# Patient Record
Sex: Female | Born: 2000
Health system: Southern US, Community
[De-identification: ages and names within clinical notes are randomized; demographics above are authoritative.]

## PROBLEM LIST (undated history)

## (undated) ENCOUNTER — Ambulatory Visit

## (undated) DIAGNOSIS — J45901 Unspecified asthma with (acute) exacerbation: Secondary | ICD-10-CM

## (undated) DIAGNOSIS — F329 Major depressive disorder, single episode, unspecified: Secondary | ICD-10-CM

## (undated) DIAGNOSIS — J45909 Unspecified asthma, uncomplicated: Secondary | ICD-10-CM

## (undated) DIAGNOSIS — T7840XA Allergy, unspecified, initial encounter: Secondary | ICD-10-CM

## (undated) DIAGNOSIS — R51 Headache: Secondary | ICD-10-CM

## (undated) DIAGNOSIS — E669 Obesity, unspecified: Secondary | ICD-10-CM

## (undated) DIAGNOSIS — Z889 Allergy status to unspecified drugs, medicaments and biological substances status: Secondary | ICD-10-CM

## (undated) DIAGNOSIS — R4589 Other symptoms and signs involving emotional state: Secondary | ICD-10-CM

## (undated) DIAGNOSIS — K219 Gastro-esophageal reflux disease without esophagitis: Secondary | ICD-10-CM

## (undated) DIAGNOSIS — R519 Headache, unspecified: Secondary | ICD-10-CM

## (undated) DIAGNOSIS — L709 Acne, unspecified: Secondary | ICD-10-CM

## (undated) HISTORY — DX: Other symptoms and signs involving emotional state: R45.89

## (undated) HISTORY — DX: Unspecified asthma with (acute) exacerbation: J45.901

## (undated) HISTORY — DX: Allergy, unspecified, initial encounter: T78.40XA

## (undated) HISTORY — DX: Gastro-esophageal reflux disease without esophagitis: K21.9

## (undated) HISTORY — PX: WISDOM TOOTH EXTRACTION: SHX21

## (undated) HISTORY — DX: Headache, unspecified: R51.9

## (undated) HISTORY — DX: Obesity, unspecified: E66.9

## (undated) HISTORY — DX: Headache: R51

## (undated) HISTORY — DX: Major depressive disorder, single episode, unspecified: F32.9

## (undated) HISTORY — DX: Unspecified asthma, uncomplicated: J45.909

## (undated) HISTORY — DX: Acne, unspecified: L70.9

---
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
## 2018-06-27 MED FILL — ALBUTEROL SULFATE HFA 108 (: 108 (90 BAS | 25 days supply | Qty: 9 | Fill #0

## 2018-07-16 MED FILL — ALBUTEROL SULFATE HFA 108 (: 108 (90 BAS | 25 days supply | Qty: 9 | Fill #1

## 2019-01-09 ENCOUNTER — Ambulatory Visit: Payer: Medicaid Other | Admitting: Advanced Practice Midwife

## 2019-05-24 ENCOUNTER — Emergency Department (HOSPITAL_COMMUNITY)
Admission: EM | Admit: 2019-05-24 | Discharge: 2019-05-24 | Disposition: A | Discharge status: Home / Self Care | Payer: Medicaid Other | Attending: Emergency Medicine | Admitting: Emergency Medicine

## 2019-05-24 ENCOUNTER — Other Ambulatory Visit: Payer: Self-pay

## 2019-05-24 ENCOUNTER — Emergency Department (HOSPITAL_COMMUNITY): Payer: Medicaid Other

## 2019-05-24 DIAGNOSIS — R0789 Other chest pain: Secondary | ICD-10-CM | POA: Diagnosis not present

## 2019-05-24 DIAGNOSIS — R110 Nausea: Secondary | ICD-10-CM | POA: Diagnosis not present

## 2019-05-24 DIAGNOSIS — R0602 Shortness of breath: Secondary | ICD-10-CM | POA: Insufficient documentation

## 2019-05-24 DIAGNOSIS — Z7722 Contact with and (suspected) exposure to environmental tobacco smoke (acute) (chronic): Secondary | ICD-10-CM | POA: Insufficient documentation

## 2019-05-24 DIAGNOSIS — N938 Other specified abnormal uterine and vaginal bleeding: Secondary | ICD-10-CM | POA: Insufficient documentation

## 2019-05-24 DIAGNOSIS — R42 Dizziness and giddiness: Secondary | ICD-10-CM | POA: Insufficient documentation

## 2019-05-24 DIAGNOSIS — F410 Panic disorder [episodic paroxysmal anxiety] without agoraphobia: Secondary | ICD-10-CM | POA: Insufficient documentation

## 2019-05-24 DIAGNOSIS — D649 Anemia, unspecified: Secondary | ICD-10-CM | POA: Insufficient documentation

## 2019-05-24 DIAGNOSIS — J45909 Unspecified asthma, uncomplicated: Secondary | ICD-10-CM | POA: Diagnosis not present

## 2019-05-24 DIAGNOSIS — R519 Headache, unspecified: Secondary | ICD-10-CM | POA: Insufficient documentation

## 2019-05-24 LAB — CBC
HCT: 28.9 % — ABNORMAL LOW (ref 36.0–46.0)
Hemoglobin: 7.1 g/dL — ABNORMAL LOW (ref 12.0–15.0)
MCH: 16.8 pg — ABNORMAL LOW (ref 26.0–34.0)
MCHC: 24.6 g/dL — ABNORMAL LOW (ref 30.0–36.0)
MCV: 68.5 fL — ABNORMAL LOW (ref 80.0–100.0)
Platelets: 492 10*3/uL — ABNORMAL HIGH (ref 150–400)
RBC: 4.22 MIL/uL (ref 3.87–5.11)
RDW: 20.9 % — ABNORMAL HIGH (ref 11.5–15.5)
WBC: 10.7 10*3/uL — ABNORMAL HIGH (ref 4.0–10.5)
nRBC: 0 % (ref 0.0–0.2)

## 2019-05-24 LAB — BASIC METABOLIC PANEL
Anion gap: 9 (ref 5–15)
BUN: 8 mg/dL (ref 6–20)
CO2: 24 mmol/L (ref 22–32)
Calcium: 9 mg/dL (ref 8.9–10.3)
Chloride: 106 mmol/L (ref 98–111)
Creatinine, Ser: 0.67 mg/dL (ref 0.44–1.00)
GFR calc Af Amer: >60 mL/min (ref >60)
GFR calc non Af Amer: >60 mL/min (ref >60)
Glucose, Bld: 92 mg/dL (ref 70–99)
Potassium: 4.3 mmol/L (ref 3.5–5.1)
Sodium: 139 mmol/L (ref 135–145)

## 2019-05-24 LAB — I-STAT BETA HCG BLOOD, ED (MC, WL, AP ONLY): I-stat hCG, quantitative: <5 m[IU]/mL (ref <5)

## 2019-05-24 LAB — TROPONIN I (HIGH SENSITIVITY): Troponin I (High Sensitivity): <2 ng/L (ref <18)

## 2019-05-24 MED ORDER — SODIUM CHLORIDE 0.9% FLUSH: 3.0000 mL | Freq: Once | INTRAVENOUS | Status: DC

## 2019-05-24 MED ORDER — FERROUS SULFATE 325 (65 FE) MG PO TABS: 325.0000 mg | ORAL_TABLET | Freq: Every day | ORAL | 0 refills | Status: DC

## 2019-05-24 NOTE — ED Notes (Signed)
Patient verbalizes understanding of discharge instructions. Opportunity for questioning and answers were provided. Armband removed by staff, pt discharged from ED.  

## 2019-05-24 NOTE — Discharge Instructions (Signed)
Start Iron pill once a day Please make an appointment with OBGYN for follow up Return to the ER if worsening

## 2019-05-24 NOTE — ED Provider Notes (Signed)
[facility] EMERGENCY DEPARTMENT Provider Note   CSN: [id number] Arrival date & time: 05/24/19  1346     History Chief Complaint  Patient presents with  . Chest Pain    Debbie Smith is a 19 y.o. female with history of migraines, asthma presents with a panic attack.  She states that she woke up this morning and felt like it was going to be a bad day but cannot really explain why.  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  She is also having sharp left-sided chest pain.  Symptoms lasted approximately 1 hour.  She came to the ED and since she has been here her symptoms have resolved.  She states that now she is having a mild frontal headache but otherwise does not have any of the symptoms she had this morning.  She reports irregular periods and sometimes will go a long time without a period and when she does have a period it will last 6 months.  She has been having light vaginal bleeding for approximately 3 weeks.  She does not take any iron supplementation.  She has been told she is anemic in the past.  She denies lightheadedness, syncope, shortness of breath, or chest pain that is been intermittent or ongoing this week. She does not take blood thinners. She denies blood in the stool.  HPI     Past Medical History:  Diagnosis Date  . Acne   . Allergy   . Asthma   . Depressed mood   . GERD (gastroesophageal reflux disease)   . Headache   . Moderate Persistent Asthma, poor control 10/07/2012  . Obesity     Patient Active Problem List   Diagnosis Date Noted  . Mild intermittent asthma, uncomplicated 06/10/2016  . Anemia 03/06/2016  . Migraine without aura and without status migrainosus, not intractable 09/24/2015  . Tension headache 09/24/2015  . Acanthosis nigricans 02/22/2015  . Vitamin D deficiency 10/15/2014  . Gastroesophageal reflux disease without  esophagitis 09/13/2014  . Depressed mood 09/13/2014  . Elevated BP 09/13/2014  . Acne vulgaris 09/13/2014  . Obesity, unspecified 10/07/2012  . Seasonal allergies 10/07/2012    Past Surgical History:  Procedure Laterality Date  . WISDOM TOOTH EXTRACTION       OB History   No obstetric history on file.     Family History  Problem Relation Age of Onset  . Asthma Mother   . Diabetes Mother   . Hypertension Mother   . Anemia Sister   . Asthma Sister   . Stroke Sister   . Mental illness Sister   . Learning disabilities Sister   . Diabetes Maternal Aunt   . Asthma Brother   . Hypertension Brother   . Mental illness Brother   . Learning disabilities Brother   . Diabetes Maternal Uncle   . Cardiomyopathy Maternal Uncle   . Sudden death Maternal Uncle   . Kidney disease Maternal Uncle   . Asthma Maternal Grandmother   . Diabetes Maternal Grandmother   . Cardiomyopathy Maternal Grandmother   . Hypertension Maternal Grandmother   . Kidney disease Maternal Grandmother   . Heart attack Maternal Grandmother     Social History   Tobacco Use  . Smoking status: Passive Smoke Exposure - Never Smoker  . Smokeless tobacco: Never Used  Substance Use Topics  . Alcohol use: No  .  Drug use: No    Home Medications Prior to Admission medications   Medication Sig Start Date End Date Taking? Authorizing Provider  albuterol (PROAIR HFA) 108 (90 Base) MCG/ACT inhaler Inhale 2 puffs into the lungs every 4 (four) hours as needed for wheezing or shortness of breath. 06/10/16   Gwenith Daily, MD  cetirizine (ZYRTEC) 10 MG tablet Take one tablet once daily as needed for allergies 06/10/16   Gwenith Daily, MD  Cholecalciferol (VITAMIN D-3) 5000 units TABS Take 5,000 Units by mouth once a week. Patient not taking: Reported on 09/22/2017 09/17/16   Keturah Shavers, MD  Cholecalciferol (VITAMIN D3) 5000 units TABS Take 1 tablet (5,000 Units total) by mouth daily. 09/22/17   Hilts,  Michael, MD  Magnesium Oxide 500 MG TABS Take by mouth.    [provider]  omeprazole (PRILOSEC) 20 MG capsule TAKE 1 CAPSULE BY MOUTH DAILY BEFORE A MEAL 03/06/16   Warnell Forester, MD  topiramate (TOPAMAX) 50 MG tablet Take 3 tablets (150 mg total) by mouth 2 (two) times daily. Patient taking differently: Take 150 mg by mouth 2 (two) times daily as needed (Headaches).  10/14/16   Keturah Shavers, MD  verapamil (CALAN) 80 MG tablet Take 1 tablet (80 mg total) by mouth 2 (two) times daily. 09/17/16   Keturah Shavers, MD    Allergies    Other  Review of Systems   Review of Systems  Respiratory: Positive for cough (resolved) and shortness of breath (resolved).   Cardiovascular: Positive for chest pain (resolved). Negative for palpitations.  Gastrointestinal: Positive for nausea (resolved). Negative for abdominal pain and vomiting.  Neurological: Positive for dizziness and headaches. Negative for syncope and light-headedness.  Psychiatric/Behavioral: The patient is nervous/anxious (resolved).   All other systems reviewed and are negative.   Physical Exam Updated Vital Signs BP (!) 157/98 (BP Location: Left Wrist)   Pulse (!) 112   Temp 98.7 F (37.1 C) (Oral)   Resp 18   Ht 5\' 9"  (1.753 m)   SpO2 100%   BMI 47.26 kg/m   Physical Exam Vitals and nursing note reviewed.  Constitutional:      General: She is not in acute distress.    Appearance: She is well-developed. She is obese. She is not ill-appearing.     Comments: Calm and cooperative. NAD  HENT:     Head: Normocephalic and atraumatic.  Eyes:     General: No scleral icterus.       Right eye: No discharge.        Left eye: No discharge.     Conjunctiva/sclera: Conjunctivae normal.     Pupils: Pupils are equal, round, and reactive to light.  Cardiovascular:     Rate and Rhythm: Normal rate and regular rhythm.  Pulmonary:     Effort: Pulmonary effort is normal. No respiratory distress.     Breath sounds: Normal  breath sounds.  Abdominal:     General: There is no distension.     Palpations: Abdomen is soft.     Tenderness: There is no abdominal tenderness.  Musculoskeletal:     Cervical back: Normal range of motion.  Skin:    General: Skin is warm and dry.  Neurological:     Mental Status: She is alert and oriented to person, place, and time.  Psychiatric:        Behavior: Behavior normal.     ED Results / Procedures / Treatments   Labs (all labs ordered are listed,  but only abnormal results are displayed) Labs Reviewed  CBC - Abnormal; Notable for the following components:      Result Value   WBC 10.7 (*)    Hemoglobin 7.1 (*)    HCT 28.9 (*)    MCV 68.5 (*)    MCH 16.8 (*)    MCHC 24.6 (*)    RDW 20.9 (*)    Platelets 492 (*)    All other components within normal limits  BASIC METABOLIC PANEL  I-STAT BETA HCG BLOOD, ED (MC, WL, AP ONLY)  TROPONIN I (HIGH SENSITIVITY)  TROPONIN I (HIGH SENSITIVITY)    EKG None  Radiology DG Chest 2 View  Result Date: 05/24/2019 CLINICAL DATA:  Chest pain. Additional provided: Patient reports left-sided chest pain onset today with shortness of breath, nausea and diaphoresis. EXAM: CHEST - 2 VIEW COMPARISON:  No pertinent prior studies available for comparison. FINDINGS: The cardiac silhouette is prominent in size, but is extension on this shallow inspiration radiograph. No appreciable airspace consolidation within the lungs. No evidence of pleural effusion or pneumothorax. No acute bony abnormality identified. IMPRESSION: The cardiac silhouette is prominent in size, although accentuated on this shallow inspiration radiograph. No evidence of airspace consolidation or pulmonary edema. Electronically Signed   By: Kellie Simmering DO   On: 05/24/2019 15:34    Procedures Procedures (including critical care time)  Medications Ordered in ED Medications  sodium chloride flush (NS) 0.9 % injection 3 mL (has no administration in time range)    ED  Course  I have reviewed the triage vital signs and the nursing notes.  Pertinent labs & imaging results that were available during my care of the patient were reviewed by me and considered in my medical decision making (see chart for details).  18 year old female presents with a reported panic attack with multiple symptoms including dizziness, shortness of breath, chest pain, nausea and diaphoresis.  She is mildly tachycardic on triage.  This is resolved on my exam.  She is essentially asymptomatic at this time and feels better other than a mild frontal headache.  EKG was sinus tachycardia.  Chest x-ray is negative.  Initial troponin is less than 2.  Chest pain is very atypical and patient states that it was from a panic attack therefore will not repeat this.  She endorses light vaginal bleeding for the past 3 weeks a well.  Hemoglobin is 7.1 down from 10 several years ago.  Her mother is at bedside and states that the patient was supposed to be taking iron but has not been.  She does not have an OB/GYN currently.  Since she is asymptomatic and bleeding is minimal we will start her on iron supplementation and have her follow-up with OB/GYN as an outpatient.  MDM Rules/Calculators/A&P                       Final Clinical Impression(s) / ED Diagnoses Final diagnoses:  Panic attack  Anemia, unspecified type  DUB (dysfunctional uterine bleeding)    Rx / DC Orders ED Discharge Orders    None       Recardo Evangelist, PA-C 05/24/19 1801    Isla Pence, MD 05/24/19 [phone]

## 2019-05-24 NOTE — ED Triage Notes (Signed)
Pt reports L sided chest pain onset today with associated shob, nausea and diaphoresis.

## 2019-07-11 ENCOUNTER — Encounter: Payer: Medicaid Other | Admitting: Women's Health

## 2019-07-25 ENCOUNTER — Other Ambulatory Visit (HOSPITAL_COMMUNITY): Payer: Self-pay | Admitting: Internal Medicine

## 2020-03-29 ENCOUNTER — Ambulatory Visit (INDEPENDENT_AMBULATORY_CARE_PROVIDER_SITE_OTHER): Payer: Medicaid Other | Admitting: Family Medicine

## 2020-03-29 ENCOUNTER — Encounter: Payer: Self-pay | Admitting: Family Medicine

## 2020-03-29 ENCOUNTER — Other Ambulatory Visit: Payer: Self-pay

## 2020-03-29 VITALS — BP 133/53 | HR 99 | Ht 69.0 in | Wt 381.2 lb

## 2020-03-29 DIAGNOSIS — Z23 Encounter for immunization: Secondary | ICD-10-CM

## 2020-03-29 DIAGNOSIS — F339 Major depressive disorder, recurrent, unspecified: Secondary | ICD-10-CM

## 2020-03-29 NOTE — Assessment & Plan Note (Addendum)
-  PHQ-9 score of 25, with score of 3 for question 9. When asked, patient states that she is currently not experiencing suicidal ideation -Reassurance provided. Encouraged participating in hobbies, engaging in meditation and utilizing support from family -list of therapists provided -suicide hotline provided -given clear instructions to call suicide hotline and/or go to ED if experiencing SI -f/u in 2 weeks for mood check and health maintenance, consider starting pharmacotherapy at this time

## 2020-03-29 NOTE — Progress Notes (Signed)
    SUBJECTIVE:   CHIEF COMPLAINT / HPI:   Depression Patient reports long standing history of depression. Initially came into the office in order to establish care as she has not been seen by a primary provider in 2-3 years. Reports that she has remembered feeling depressed periodically since the age of 35 but was not formally diagnosed until 20 years old. Lives with her sister but describes her mother as a significant role in her support system. Was on medication before but does not remember what medication, currently not on any medication as she previously stopped because "they didn't work." She has never participating in therapy sessions. Denies any prior psychiatric-related hospitalizations including the multiple times she attempted. First attempt was at 20 years old when she overdosed on a bottle of sleeping pills, she ended up sleeping for 15 hours and then woke up. Her most recent attempt was last year (2021) when she tried to cut herself with a hunter's knife. She ended up cutting herself superficially. Describes that what eventually stops her during these attempts is that she eventually calms down but really depends on the trigger, she also thinks of her mom which she says helps. Her trigger usually includes feeling like she has no control over her life. She tends to feel guilty whenever she takes time to care for herself. She sometimes feels useless and lies to sleep a lot, denies any trouble falling or staying asleep. Appetite is typically her wanting to eat only one meal daily. Shares that she feels she has a lot of responsibilities at home. Hobbies include reading and playing video games. Denies thoughts of harming herself but states that she does experience suicidal ideations occasionally with the last time being 2 weeks ago which soon resolved when she went to sleep.    OBJECTIVE:   BP (!) 133/53   Pulse 99   Ht 5\' 9"  (1.753 m)   Wt (!) 381 lb 3.2 oz (172.9 kg)   LMP 01/27/2020   SpO2  100%   BMI 56.29 kg/m   General: Patient well-appearing, in no acute distress. Neck: supple neck, non-tender thyroid, no evidence of lymphadenopathy CV: RRR, no murmurs or gallops auscultated Resp: CTAB, no rales or rhonchi  Abdomen: soft, nontender, BS+ Derm: skin warm and dry to touch, xerosis noted throughout both UE and LE bilaterally Ext: no LE edema noted bilaterally, no cuts fresh or healed noted along UE bilaterally  Neuro: alert, appropriately conversational, demonstrates logical and tangential thinking process Psych: mood appropriate, denies SI without plan or intent  ASSESSMENT/PLAN:   Depression, recurrent (HCC) -PHQ-9 score of 25, with score of 3 for question 9. When asked, patient states that she is currently not experiencing suicidal ideation -Reassurance provided. Encouraged participating in hobbies, engaging in meditation and utilizing support from family -list of therapists provided -suicide hotline provided -given clear instructions to call suicide hotline and/or go to ED if experiencing SI -f/u in 2 weeks for mood check and health maintenance, consider starting pharmacotherapy at this time    03/26/2020, DO [facility]

## 2020-03-29 NOTE — Patient Instructions (Addendum)
It was great seeing you today!  Today we discussed your mood. I want you to try the things that we talked about daily. Also try to participate in your hobbies and surrounding yourself by family and friends who are a significant part of your support system.   I am sorry that we did not get to address all your concerns at this visit. At your next appointment, we will try to address more of other concerns as well.   Please follow up at your next scheduled appointment in 2 weeks, if anything arises between now and then, please don't hesitate to contact our office.   Thank you for allowing [place] to be a part of your medical care!  Thank you, Dr. Robyne Peers    Therapy and Counseling Resources Most providers on this list will take Medicaid. Patients with commercial insurance or Medicare should contact their insurance company to get a list of in network providers.  [facility]ing [address] [place], [place] [address] [phone]  [facility]  [address] [place], [place] [address]      [phone]  [facility] [address]  [place], [place] [address] [phone]  [facility] [address], [address]  [place], [place] [address]       [phone]      Jovita Kussmaul [facility] [address], [place], [place] [phone]  [facility]:  [address] [place] [phone]  [facility]:  [address] [place] [place]ien[place] [phone]  [facility] (under & uninsured) [address]   [place] [phone]    [email or web address]    [facility] [address] . [place]    [phone]  [facility] -[address]     Phone:  [phone]     [facility]  [address]  [phone]   [facility] [address]       [place], [place] [phone]  [facility]: [address] [place], [place], [place]  [phone]   [facility] (Spanish therapist) [email or web address]  [address]   [place] [address]    [phone]    [facility]   [address],  [place], [place]  [phone]   [facility]  [address] [place]  [phone]  [facility]  [address] [place], [place]        [phone]  Open Access/Walk In Clinic under & uninsured  [facility]  [address] [place], [place] [phone] Crisis [phone]  [facility] [place],  (Spanish)   [address], [place]: [phone]) 8:30 - 12; 1 - 2:30  [facility] [employer],  [address] [place], [place]    [phone]):8:30 - 12; 2 - 3PM  [facility] [employer],  [address]  [place]; [phone]):   Mon - Fri 8 AM - 5 PM  [facility] [address] [place]  MWF 12:30 to 3:00 or call to schedule an appointment  [phone]  Specific Provider options Psychology Today  [email or web address] 1. click on find a therapist  2. enter your zip code 3. left side and select or tailor a therapist for your specific need.  [facility] Provider Directory [email or web address]  (Medicaid)   Follow all drop down to find a provider  Social Support program [facility] [place] [phone] or [employer] [address], [place], [place] Recovery support and educational   24- Hour Availability:  .  Marland Kitchen [facility]  . [address] [place], [place] [employer] [phone] Crisis [phone]  . [facility] [phone]  [facility]  [phone]   . [facility] [employer]  [phone] (after  hours)  . [facility]/Mobile Crisis   [phone]  . [place] National Suicide Hotline  641-671-2147 (TALK)  . Call 911 or go to emergency room  . [employer]  [phone]);  [place] and [employer]   . [facility] ACCESS  [phone]); [place], [place], [place], [place], [place], [place], [place]    If you are feeling suicidal or depression symptoms worsen please immediately go to:   24 Hour Availability [facility]  [address] [place], [place] [phone] Crisis [phone]    . If you are thinking about harming yourself or having thoughts of suicide, or if you know someone who is, seek help right away. . Call your doctor or mental health care provider. . Call 911 or go to a hospital emergency room to get immediate help, or ask a friend or family member to help you do these things. . Call the [place] National Suicide Prevention Lifeline's toll-free, 24-hour hotline at 1-800-273-TALK 5088877111) or TTY: 1-800-799-4 TTY 603-237-8659) to talk to a trained counselor. . If you are in crisis, make sure you are not left alone.  . If someone else is in crisis, make sure he or she is not left alone   [facility] of the [employer] (Domestic Violence, Rape & Victim Assistance [phone]  [facility] [employer] Crisis Services    (ONLY from 8am-4pm)    [phone]  [facility] Mobile Crisis Unit (24/7)   [phone]  [place] National Suicide Hotline   6163979904 Len Childs)

## 2020-04-10 ENCOUNTER — Ambulatory Visit: Payer: Medicaid Other | Admitting: Family Medicine

## 2020-04-26 ENCOUNTER — Other Ambulatory Visit (HOSPITAL_COMMUNITY): Payer: Self-pay

## 2020-04-26 ENCOUNTER — Other Ambulatory Visit (HOSPITAL_COMMUNITY): Payer: Self-pay | Admitting: Internal Medicine

## 2020-05-07 ENCOUNTER — Other Ambulatory Visit (HOSPITAL_COMMUNITY): Payer: Self-pay

## 2020-05-07 ENCOUNTER — Other Ambulatory Visit: Payer: Self-pay | Admitting: Internal Medicine

## 2020-05-14 ENCOUNTER — Other Ambulatory Visit: Payer: Self-pay | Admitting: Internal Medicine

## 2020-05-14 ENCOUNTER — Other Ambulatory Visit (HOSPITAL_COMMUNITY): Payer: Self-pay

## 2020-05-23 ENCOUNTER — Other Ambulatory Visit (HOSPITAL_COMMUNITY): Payer: Self-pay

## 2020-05-23 NOTE — Telephone Encounter (Signed)
Okay, thank you. I called and left a message with the pt.

## 2020-05-30 ENCOUNTER — Other Ambulatory Visit (HOSPITAL_COMMUNITY): Payer: Self-pay

## 2020-07-01 ENCOUNTER — Other Ambulatory Visit: Payer: Self-pay

## 2020-07-01 ENCOUNTER — Ambulatory Visit (INDEPENDENT_AMBULATORY_CARE_PROVIDER_SITE_OTHER): Payer: Medicaid Other

## 2020-07-01 ENCOUNTER — Ambulatory Visit (INDEPENDENT_AMBULATORY_CARE_PROVIDER_SITE_OTHER): Payer: Medicaid Other | Admitting: Family Medicine

## 2020-07-01 ENCOUNTER — Other Ambulatory Visit (HOSPITAL_COMMUNITY): Payer: Self-pay

## 2020-07-01 VITALS — BP 120/85 | HR 91 | Ht 69.0 in | Wt 380.2 lb

## 2020-07-01 DIAGNOSIS — F32A Depression, unspecified: Secondary | ICD-10-CM

## 2020-07-01 DIAGNOSIS — J302 Other seasonal allergic rhinitis: Secondary | ICD-10-CM

## 2020-07-01 DIAGNOSIS — J4520 Mild intermittent asthma, uncomplicated: Secondary | ICD-10-CM | POA: Diagnosis present

## 2020-07-01 DIAGNOSIS — Z23 Encounter for immunization: Secondary | ICD-10-CM

## 2020-07-01 DIAGNOSIS — F339 Major depressive disorder, recurrent, unspecified: Secondary | ICD-10-CM

## 2020-07-01 MED ORDER — ESCITALOPRAM OXALATE 10 MG PO TABS
10.0000 mg | ORAL_TABLET | Freq: Every day | ORAL | 0 refills | Status: DC
Start: 2020-07-01 — End: 2020-09-24
  Filled 2020-07-01 (×2): qty 14, 14d supply, fill #0

## 2020-07-01 MED ORDER — BUDESONIDE-FORMOTEROL FUMARATE 80-4.5 MCG/ACT IN AERO
2.0000 | INHALATION_SPRAY | RESPIRATORY_TRACT | 3 refills | Status: DC | PRN
Start: 2020-07-01 — End: 2022-03-26
  Filled 2020-07-01: qty 1, fill #0
  Filled 2020-07-01: qty 10.2, 30d supply, fill #0
  Filled 2020-11-11: qty 10.2, 30d supply, fill #1
  Filled 2021-03-11: qty 10.2, 30d supply, fill #0

## 2020-07-01 MED ORDER — AEROCHAMBER MINI CHAMBER DEVI
1.0000 [IU] | 0 refills | Status: AC | PRN
  Filled 2020-07-01: qty 1, fill #0

## 2020-07-01 MED ORDER — CETIRIZINE HCL 10 MG PO TABS: ORAL_TABLET | ORAL | 11 refills | Status: DC

## 2020-07-01 NOTE — Patient Instructions (Signed)
Dear Evert Kohl,   Today we discussed the following:   Asthma   Start using Symbicort as needed for symptoms  If you are having symptoms more than 4 times a week, you will need to return for further managment   Depression  We will restart lexapro today  Please come back in 1 week for dose increase and refills Please see below for list of counselors and therapists in the area as well as what to do in a mental health crisis   Be well,   Dr. Selena Batten  ===================================================================================  Support in a Crisis What if I or someone I know is in crisis?  If you are thinking about harming yourself or having thoughts of suicide, or if you know someone who is, seek help right away. Call your doctor or mental health care provider. Call 911 or go to a hospital emergency room to get immediate help, or ask a friend or family member to help you do these things. Call the [place] National Suicide Prevention Lifeline's toll-free, 24-hour hotline at 1-800-273-TALK (403)886-9083) or TTY: 1-800-799-4 TTY (803)771-6725) to talk to a trained counselor. If you are in crisis, make sure you are not left alone.  If someone else is in crisis, make sure he or she is not left alone  24 Hour Availability [facility]  [address] , [place], [place] [address]  [phone]  For Crisis: [phone]   [facility] Service of the [employer] (Domestic Violence, Rape & Victim Assistance [phone]  [employer] [facility]  [address], [place]  [address]               [phone] or [phone]  [facility]    (ONLY from 8am-4pm)    [phone]  [facility] Mobile Crisis Unit (24/7)   [phone]  [place] National Suicide Hotline   [phone] Len Childs)         Therapy and Counseling Resources Most providers on this list will take Medicaid. Patients  with commercial insurance or Medicare should contact their insurance company to get a list of in network providers.  [facility]Psychiatry and Counseling [address] [place], [place] [address] [phone]  [facility]  [address] Suite [place], [place] [address]      [phone]  [facility] [address]  [place], [place] [address] [phone]  [facility] [address], [address]  [place], [place] [address]       [phone]     [facility] (virtual only) [phone]  Jovita Kussmaul [facility] [address] [place], [place] [phone]  [facility]:  [address] [place] [phone]  [facility]:  [address] [place] Diener [phone]  [facility] (under & uninsured) [address]   [place] [phone]    [email or web address]    [facility] [address]  [place]    [phone]  [facility] -[address]     Phone:  [phone]     [facility]-  [address]  [phone]   [facility] [address]      [place], [place] [phone]  [facility]: [address] [place], [place], [place]  [phone]   [facility] (Spanish therapist) [email or web address]  [address]   [place] [address]    [phone]    [facility]   [address],  [place], [place]  [phone]   [facility]  [address] [place]  [phone]  [facility]  [address] [place], [place]        [phone]  Open Access/Walk In Clinic under & uninsured  [facility]  [address] [place], [place] [phone] Crisis [phone]  [facility] of the [place],  (Spanish)   [address], [place]: [phone]) 8:30 - 12; 1 - 2:30  Family Service of the [employer],  [address] [place], [place]    [phone]):8:30 - 12; 2 - 3PM  [facility] [employer],  [address],  [place]; [phone]):   Mon - Fri 8 AM - 5 PM  [facility] [address] [place]  MWF 12:30 to 3:00 or call to schedule an appointment  [phone]  Specific Provider options Psychology Today  [email or web address] click on find a therapist  enter your zip code left side and select or tailor a therapist for your specific need.   [facility] Provider Directory [email or web address]  (Medicaid)   Follow all drop down to find a provider  Social Support program Mental Health [place] [phone] or [employer] [address], [place], [place] Recovery support and educational   24- Hour Availability:   [facility]  [address] [place], [place] [phone] Crisis [phone]  Family Service of the [facility] [phone]  [place] [facility]  [phone]   [facility]  [phone] (after hours)  Therapeutic Alternative/Mobile Crisis   [phone]  [place] National Suicide Hotline  [phone] Len Childs)  Call 911 or go to emergency room  [facility]  [phone]);  [place] and [employer]  [phone]); [place], [place], [place], [place], [place], [place], [place]

## 2020-07-01 NOTE — Assessment & Plan Note (Signed)
-   start on symbicort PRN, instead of albuterol, Rx for chamber and instructions for use - instruction to return if symtpoms occurring > 4 x a week for stepwise management  - Consider repeating PFTs as prior were done when she was young and no record.

## 2020-07-01 NOTE — Assessment & Plan Note (Signed)
-   Lexapro sent to pharmacy (14 days).  Did not send 30 days as patient does not have good follow-up. -Return in 1 week for follow-up - list of counselors provided

## 2020-07-01 NOTE — Progress Notes (Signed)
   SUBJECTIVE:  CHIEF COMPLAINT / HPI:   Asthma Follow UP  Ran out of albuterol inhaler last month. Prior to running out, was taking multiple times a day when she needed it, but not everyday. Does endorses wheezing and SOB when this does happen. Has not ever been hospitalized and no recent ED visits (patient reports more when she was young). Never on controller medication.   Anxiety/depression  Patient also requesting lexapro refill. Patient reports that she was started on the lower dose of lexpro in November, but never went back for higher dosing. Thoughts of hurting herself daily. Never had a plan or tried to commit suicide. Hx of cutting, but never too deep to hurt herself or to the point that it shows. Cutting occurs very rarely. Has been dealing with this since she was 6. Has never had counseling/therapy. Lives with mom, who she doesn't share this information with. PHQ 9 is 0 today. MDQ i 4, no to question to.  Medications: updated list.   PERTINENT  PMH / PSH: Depression, anemia, acanthosis, obesity, eczema, seasonal allergies  OBJECTIVE:  BP 120/85   Pulse 91   Ht 5\' 9"  (1.753 m)   Wt (!) 380 lb 3.2 oz (172.5 kg)   LMP 06/28/2020   SpO2 99%   BMI 56.15 kg/m   General: well appearing, NAD  Lungs: CTAB, no wheezing appreciated.  Speaking full sentences Psych: Patient is well groomed with good hygiene.  Speech is normal with normal rate, latency, volume and intonation.  Behavior is normal with normal eye contact.  Patient is friendly, cooperative.  Tight and logical thought process is.  No abnormal thought content.  Mood is good with appropriate affect and range of emotion.   ASSESSMENT/PLAN:  Depression, recurrent (HCC) - Lexapro sent to pharmacy (14 days).  Did not send 30 days as patient does not have good follow-up. -Return in 1 week for follow-up - list of counselors provided   Mild intermittent asthma, uncomplicated - start on symbicort PRN, instead of albuterol, Rx for  chamber and instructions for use - instruction to return if symtpoms occurring > 4 x a week for stepwise management  - Consider repeating PFTs as prior were done when she was young and no record.     08/28/2020, MD [facility]

## 2020-07-02 ENCOUNTER — Other Ambulatory Visit (HOSPITAL_COMMUNITY): Payer: Self-pay

## 2020-07-07 NOTE — Patient Instructions (Signed)
Erroneous

## 2020-07-07 NOTE — Progress Notes (Deleted)
    SUBJECTIVE:   CHIEF COMPLAINT / HPI:   Depression, recurrent (HCC) Patient also requesting lexapro refill. Patient reports that she was started on the lower dose of lexpro in November, but never went back for higher dosing. Thoughts of hurting herself daily. Never had a plan or tried to commit suicide. Hx of cutting, but never too deep to hurt herself or to the point that it shows. Cutting occurs very rarely. Has been dealing with this since she was 6. Has never had counseling/therapy. Lives with mom, who she doesn't share this information with. PHQ 9 is 0 today. MDQ i 4, no to question to.- Lexapro sent to pharmacy (14 days).  Did not send 30 days as patient does not have good follow-up. -Return in 1 week for follow-up - list of counselors provided    Mild intermittent asthma, uncomplicated Ran out of albuterol inhaler last month. Prior to running out, was taking multiple times a day when she needed it, but not everyday. Does endorses wheezing and SOB when this does happen. Has not ever been hospitalized and no recent ED visits (patient reports more when she was young). Never on controller medication.- start on symbicort PRN, instead of albuterol, Rx for chamber and instructions for use - instruction to return if symtpoms occurring > 4 x a week for stepwise management - Consider repeating PFTs as prior were done when she was young and no record.   PERTINENT  PMH / PSH:  Patient Active Problem List   Diagnosis Date Noted   Depression, recurrent (HCC) 03/29/2020   Mild intermittent asthma, uncomplicated 06/10/2016   Anemia 03/06/2016   Migraine without aura and without status migrainosus, not intractable 09/24/2015   Tension headache 09/24/2015   Acanthosis nigricans 02/22/2015   Vitamin D deficiency 10/15/2014   Gastroesophageal reflux disease without esophagitis 09/13/2014   Depressed mood 09/13/2014   Elevated BP 09/13/2014   Acne vulgaris 09/13/2014   Obesity, unspecified  10/07/2012   Seasonal allergies 10/07/2012     OBJECTIVE:   LMP 06/28/2020    Physical exam: General: *** Respiratory: *** Cardio: *** Abdomen: ***   ASSESSMENT/PLAN:   No problem-specific Assessment & Plan notes found for this encounter.     Dollene Cleveland, DO [facility]   {    This will disappear when note is signed, click to select method of visit    :1}

## 2020-07-08 ENCOUNTER — Ambulatory Visit (INDEPENDENT_AMBULATORY_CARE_PROVIDER_SITE_OTHER): Payer: Medicaid Other | Admitting: Family Medicine

## 2020-07-08 DIAGNOSIS — Z91199 Patient's noncompliance with other medical treatment and regimen due to unspecified reason: Secondary | ICD-10-CM | POA: Insufficient documentation

## 2020-07-08 DIAGNOSIS — J4520 Mild intermittent asthma, uncomplicated: Secondary | ICD-10-CM

## 2020-07-08 DIAGNOSIS — F339 Major depressive disorder, recurrent, unspecified: Secondary | ICD-10-CM

## 2020-07-08 DIAGNOSIS — Z5329 Procedure and treatment not carried out because of patient's decision for other reasons: Secondary | ICD-10-CM

## 2020-07-08 NOTE — Progress Notes (Addendum)
Patient was schedule to follow up for Depression and asthma but was a no-show to her 07/08/2020 appt.   Patient can reschedule as needed.     Depression, recurrent [facility]) Patient also requesting lexapro refill. Patient reports that she was started on the lower dose of lexpro in November, but never went back for higher dosing. Thoughts of hurting herself daily. Never had a plan or tried to commit suicide. Hx of cutting, but never too deep to hurt herself or to the point that it shows. Cutting occurs very rarely. Has been dealing with this since she was 6. Has never had counseling/therapy. Lives with mom, who she doesn't share this information with. PHQ 9 is 0 today. MDQ i 4, no to question to.- Lexapro sent to pharmacy (14 days).  Did not send 30 days as patient does not have good follow-up. -Return in 1 week for follow-up - list of counselors provided      Mild intermittent asthma, uncomplicated Ran out of albuterol inhaler last month. Prior to running out, was taking multiple times a day when she needed it, but not everyday. Does endorses wheezing and SOB when this does happen. Has not ever been hospitalized and no recent ED visits (patient reports more when she was young). Never on controller medication.- start on symbicort PRN, instead of albuterol, Rx for chamber and instructions for use - instruction to return if symtpoms occurring > 4 x a week for stepwise management - Consider repeating PFTs as prior were done when she was young and no record.     Dollene Cleveland, DO [place] [facility]

## 2020-09-24 ENCOUNTER — Encounter: Payer: Self-pay | Admitting: Family Medicine

## 2020-09-24 ENCOUNTER — Other Ambulatory Visit: Payer: Self-pay

## 2020-09-24 ENCOUNTER — Other Ambulatory Visit (HOSPITAL_COMMUNITY): Payer: Self-pay

## 2020-09-24 ENCOUNTER — Ambulatory Visit (INDEPENDENT_AMBULATORY_CARE_PROVIDER_SITE_OTHER): Payer: Medicaid Other | Admitting: Family Medicine

## 2020-09-24 VITALS — BP 103/50 | HR 105 | Ht 69.0 in | Wt 383.0 lb

## 2020-09-24 DIAGNOSIS — N921 Excessive and frequent menstruation with irregular cycle: Secondary | ICD-10-CM

## 2020-09-24 DIAGNOSIS — Z23 Encounter for immunization: Secondary | ICD-10-CM

## 2020-09-24 DIAGNOSIS — Z3009 Encounter for other general counseling and advice on contraception: Secondary | ICD-10-CM | POA: Diagnosis not present

## 2020-09-24 DIAGNOSIS — N920 Excessive and frequent menstruation with regular cycle: Secondary | ICD-10-CM | POA: Insufficient documentation

## 2020-09-24 DIAGNOSIS — Z6841 Body Mass Index (BMI) 40.0 and over, adult: Secondary | ICD-10-CM | POA: Diagnosis not present

## 2020-09-24 DIAGNOSIS — Z1159 Encounter for screening for other viral diseases: Secondary | ICD-10-CM | POA: Diagnosis not present

## 2020-09-24 DIAGNOSIS — Z114 Encounter for screening for human immunodeficiency virus [HIV]: Secondary | ICD-10-CM

## 2020-09-24 DIAGNOSIS — F339 Major depressive disorder, recurrent, unspecified: Secondary | ICD-10-CM

## 2020-09-24 MED ORDER — NORGESTIMATE-ETH ESTRADIOL 0.25-35 MG-MCG PO TABS
1.0000 | ORAL_TABLET | Freq: Every day | ORAL | 1 refills | Status: DC
  Filled 2020-09-24: qty 84, 84d supply, fill #0

## 2020-09-24 MED ORDER — ESCITALOPRAM OXALATE 10 MG PO TABS
10.0000 mg | ORAL_TABLET | Freq: Every day | ORAL | 0 refills | Status: DC
  Filled 2020-09-24: qty 14, 14d supply, fill #0

## 2020-09-24 NOTE — Assessment & Plan Note (Addendum)
-  pending CBC, TSH and prolactin -birth control counseling provided, patient prefers nexplanon implant  -appointment scheduled for 9/22 for nexplanon insertion, started on sprintec in the interim given lack of contraindications  -counseled on safe sex practices  -encouraged PAP smear at earliest convenience -follow up in 2 months with PCP, earlier as appropriate  -if menorrhagia continues for the next few months, may consider pelvic ultrasound but not at this time

## 2020-09-24 NOTE — Progress Notes (Signed)
flu

## 2020-09-24 NOTE — Assessment & Plan Note (Signed)
-  PHQ-9 score of 2 with negative question 9 reviewed and discussed -refills of lexapro provided -patient politely declines therapy at this time  -reassurance provided

## 2020-09-24 NOTE — Patient Instructions (Addendum)
It was great seeing you today!  Regarding your irregular menstrual bleeding, we have obtained many labs, I will let you know of any abnormal results. Please follow up to get your PAP smear at your earliest convenience. We have scheduled you to get your nexplanon placed on 9/22. In the meantime please take the sprintec pill daily, please remember to take this medication daily and try to take it at the same time daily. You may want to set an alarm to help.   I have also provided you with refills on your lexapro.   Please follow up at your next scheduled appointment on 9/22, if anything arises between now and then, please don't hesitate to contact our office.  Please follow up with me in 2 months.   Thank you for allowing [place] to be a part of your medical care!  Thank you, Dr. Robyne Peers

## 2020-09-24 NOTE — Progress Notes (Addendum)
    SUBJECTIVE:   CHIEF COMPLAINT / HPI:   Patient presents with 4 week history of continued menstruation. States that prior to this, she had not had a menstrual cycle for 2 months. Endorses vaginal bleeding for 8 months last year. Noticed this change since she turned 20 years old when she began to have irregular cycles. Prior to 20 years old, she had regular, monthly cycles that lasted a week at the longest. Currently goes through about 4 pads a day. Currently not on birth control but was on it briefly with last PCP who discontinued for unknown reason. She has never had a PAP smear before. Denies any other associated symptoms including any activity level changes. Denies vision changes, history of cancer, blood clots including DVT and migraines with aura. Denies current sexual activity and has never been sexually active.   Depression Complaint on lexapro, but needs refills. Feels that her mood has been good for awhile. Reports that her support system is her mother. Currently in the process of seeking a job. Previously did see a therapist but is not currently, she politely declines to wanting to participate in therapy at this time. Denies any concerns regarding this.   OBJECTIVE:   BP (!) 103/50   Pulse (!) 105   Ht 5\' 9"  (1.753 m)   Wt (!) 383 lb (173.7 kg)   LMP 09/01/2020   SpO2 100%   BMI 56.56 kg/m   General: Patient well-appearing, in no acute distress. HEENT: non-tender thyroid, no cervical lymphadenopathy  CV: RRR, no murmurs or gallops auscultated Resp: CTAB, no wheezing, rales or rhonchi Abdomen: soft, nontender, presence of bowel sounds Ext: radial pulses strong and equal bilaterally, no LE edema noted bilaterally Neuro: normal gait Psych: mood appropriate, denies SI or HI  ASSESSMENT/PLAN:   Menorrhagia -pending CBC, TSH and prolactin -birth control counseling provided, patient prefers nexplanon implant  -appointment scheduled for 9/22 for nexplanon insertion, started on  sprintec in the interim given lack of contraindications  -counseled on safe sex practices  -encouraged PAP smear at earliest convenience -follow up in 2 months with PCP, earlier as appropriate  -if menorrhagia continues for the next few months, may consider pelvic ultrasound but not at this time  Depression, recurrent (HCC) -PHQ-9 score of 2 with negative question 9 reviewed and discussed -refills of lexapro provided -patient politely declines therapy at this time  -reassurance provided    Health maintenance  -pending HIV and Hep C screening -pending lipid panel and A1c  -encouraged PAP smear at earliest convenience  -received influenza vaccine today   10/22, DO [facility]

## 2020-09-25 ENCOUNTER — Other Ambulatory Visit (HOSPITAL_COMMUNITY): Payer: Self-pay

## 2020-09-25 LAB — HCV AB W REFLEX TO QUANT PCR: HCV Ab: 0.1 s/co ratio (ref 0.0–0.9)

## 2020-09-25 LAB — COMPREHENSIVE METABOLIC PANEL
ALT: 16 IU/L (ref 0–32)
AST: 17 IU/L (ref 0–40)
Albumin/Globulin Ratio: 1.3 (ref 1.2–2.2)
Albumin: 4.3 g/dL (ref 3.9–5.0)
Alkaline Phosphatase: 63 IU/L (ref 44–121)
BUN/Creatinine Ratio: 16 (ref 9–23)
BUN: 10 mg/dL (ref 6–20)
Bilirubin Total: <0.2 mg/dL (ref 0.0–1.2)
CO2: 22 mmol/L (ref 20–29)
Calcium: 9.6 mg/dL (ref 8.7–10.2)
Chloride: 104 mmol/L (ref 96–106)
Creatinine, Ser: 0.62 mg/dL (ref 0.57–1.00)
Globulin, Total: 3.2 g/dL (ref 1.5–4.5)
Glucose: 91 mg/dL (ref 65–99)
Potassium: 4.7 mmol/L (ref 3.5–5.2)
Sodium: 140 mmol/L (ref 134–144)
Total Protein: 7.5 g/dL (ref 6.0–8.5)
eGFR: 129 mL/min/{1.73_m2} (ref >59)

## 2020-09-25 LAB — HEMOGLOBIN A1C
Est. average glucose Bld gHb Est-mCnc: 126 mg/dL
Hgb A1c MFr Bld: 6 % — ABNORMAL HIGH (ref 4.8–5.6)

## 2020-09-25 LAB — CBC
Hematocrit: 31.5 % — ABNORMAL LOW (ref 34.0–46.6)
Hemoglobin: 8.2 g/dL — ABNORMAL LOW (ref 11.1–15.9)
MCH: 17.8 pg — ABNORMAL LOW (ref 26.6–33.0)
MCHC: 26 g/dL — ABNORMAL LOW (ref 31.5–35.7)
MCV: 68 fL — ABNORMAL LOW (ref 79–97)
Platelets: 518 10*3/uL — ABNORMAL HIGH (ref 150–450)
RBC: 4.61 x10E6/uL (ref 3.77–5.28)
RDW: 17.9 % — ABNORMAL HIGH (ref 11.7–15.4)
WBC: 9 10*3/uL (ref 3.4–10.8)

## 2020-09-25 LAB — LIPID PANEL
Chol/HDL Ratio: 3.7 ratio (ref 0.0–4.4)
Cholesterol, Total: 125 mg/dL (ref 100–199)
HDL: 34 mg/dL — ABNORMAL LOW (ref >39)
LDL Chol Calc (NIH): 73 mg/dL (ref 0–99)
Triglycerides: 92 mg/dL (ref 0–149)
VLDL Cholesterol Cal: 18 mg/dL (ref 5–40)

## 2020-09-25 LAB — PROLACTIN: Prolactin: 7 ng/mL (ref 4.8–23.3)

## 2020-09-25 LAB — TSH: TSH: 2.54 u[IU]/mL (ref 0.450–4.500)

## 2020-09-25 LAB — HIV ANTIBODY (ROUTINE TESTING W REFLEX): HIV Screen 4th Generation wRfx: NONREACTIVE

## 2020-09-25 LAB — HCV INTERPRETATION

## 2020-10-10 ENCOUNTER — Ambulatory Visit (INDEPENDENT_AMBULATORY_CARE_PROVIDER_SITE_OTHER): Payer: Medicaid Other | Admitting: Family Medicine

## 2020-10-10 ENCOUNTER — Other Ambulatory Visit (HOSPITAL_COMMUNITY): Payer: Self-pay

## 2020-10-10 ENCOUNTER — Other Ambulatory Visit: Payer: Self-pay

## 2020-10-10 VITALS — BP 106/68 | HR 102 | Wt 381.0 lb

## 2020-10-10 DIAGNOSIS — D509 Iron deficiency anemia, unspecified: Secondary | ICD-10-CM

## 2020-10-10 DIAGNOSIS — N921 Excessive and frequent menstruation with irregular cycle: Secondary | ICD-10-CM

## 2020-10-10 DIAGNOSIS — Z30019 Encounter for initial prescription of contraceptives, unspecified: Secondary | ICD-10-CM | POA: Diagnosis not present

## 2020-10-10 DIAGNOSIS — Z3046 Encounter for surveillance of implantable subdermal contraceptive: Secondary | ICD-10-CM

## 2020-10-10 LAB — POCT URINE PREGNANCY: Preg Test, Ur: NEGATIVE

## 2020-10-10 MED ORDER — FERROUS SULFATE 325 (65 FE) MG PO TABS
325.0000 mg | ORAL_TABLET | Freq: Every day | ORAL | 1 refills | Status: DC
  Filled 2020-10-10: qty 30, 30d supply, fill #0

## 2020-10-10 NOTE — Progress Notes (Signed)
     SUBJECTIVE:   CHIEF COMPLAINT / HPI:   Menorrhagia:  She was recently evaluated by PCP for this and was started on OCP. Bleeding has improved since then, and now only spotting. She has endorsed some intermittent abdominal cramping since she started her birth control. No other concerns.   Contraceptive management: Here for contraceptive management. PCP discussed Nexplanon with her, which will potentially help with her heavy menses. No concerns today.  Microcytic anemia: Not taking iron supplement. She stated she was not informed she was to take iron supplement.  PERTINENT  PMH / PSH: PMX reviewed  OBJECTIVE:   Vitals:   10/10/20 1053  BP: 106/68  Pulse: (!) 102  SpO2: 98%  Weight: (!) 381 lb (172.8 kg)    Physical Exam Vitals reviewed.  Constitutional:      Appearance: Normal appearance. She is obese.  Pulmonary:     Effort: Pulmonary effort is normal.  Skin:    Comments: Excessive hyperpigmentation of her nasal ridge and forehead as well as her arm.  Neurological:     Mental Status: She is alert.  Psychiatric:        Mood and Affect: Mood normal.     ASSESSMENT/PLAN:   Menorrhagia Upreg negative. Nexplanon inserted today. May continue OCP for 7 more days. Return to see PCP soon if symptoms persist.  NB: She will benefit from PCOS evaluation as the cause of this problem given excessive hyperpigmentation. Lab offered today but she declined blood test. In this case, she sill also need pelvic U/S to r/o endometrial hyperplasia. I will message PCP to ensure adequate follow-up and management.  Microcytic anemia Likely from chronic menorrhagia. I advised her to obtain iron OTC - prescription sent as well. I offered anemia panel testing today. However, she declined blood test. Prefers to f/u with her PCP.   Contraceptive management: Upreg negative. See Nexplanon procedure note below.   Nexplanon Insertion Procedure Note  PRE-OP DIAGNOSIS: desired  long-term, reversible contraception   POST-OP DIAGNOSIS: Same   PROCEDURE: Nexplanon  placement  Performing Physician: Janit Pagan, MD, MPH    PROCEDURE:   Site (check):       [X]       Right Arm. She is left handed.  Lot  #  Exp Date: 06/20/2022  Sterile Preparation:    [X_]      Betadine          Insertion site was selected 10 cm from medial epicondyle and marked along with guiding site (3cm below the sulcus) using sterile marker  Procedure area was prepped and draped in a sterile fashion. 4mL of  2% lidocaine with epinephrine used for subcutaneous  anesthesia. Anesthesia confirmed.   Nexplanon  trocar was inserted subcutaneously and then Nexplanon  capsule delivered subcutaneously.Trocar was removed from the insertion site. Nexplanon  capsule was palpated by provider and patient to assure satisfactory placement.  Estimated blood loss of 0 mL  Dressings applied:     X Gauze/Tape      Followup: The patient tolerated the procedure well without  complications.  Standard post-procedure care is explained and return  precautions are given.  1m, MD [facility]

## 2020-10-10 NOTE — Patient Instructions (Signed)
Nexplanon Instructions After Insertion  Keep bandage clean and dry for 24 hours  May use ice/Tylenol/Ibuprofen for soreness or pain  If you develop fever, drainage or increased warmth from incision site-contact office immediately  Continue oral birth control for 7 more days and then stop it.  Return soon to PCP for  PAP, Anemia panel and if your bleeding returns.

## 2020-10-10 NOTE — Assessment & Plan Note (Signed)
Upreg negative. Nexplanon inserted today. May continue OCP for 7 more days. Return to see PCP soon if symptoms persist.  NB: She will benefit from PCOS evaluation as the cause of this problem given excessive hyperpigmentation. Lab offered today but she declined blood test. In this case, she sill also need pelvic U/S to r/o endometrial hyperplasia. I will message PCP to ensure adequate follow-up and management.

## 2020-10-10 NOTE — Assessment & Plan Note (Signed)
Likely from chronic menorrhagia. I advised her to obtain iron OTC - prescription sent as well. I offered anemia panel testing today. However, she declined blood test. Prefers to f/u with her PCP.

## 2020-10-30 ENCOUNTER — Other Ambulatory Visit (HOSPITAL_COMMUNITY): Payer: Self-pay

## 2020-10-30 ENCOUNTER — Ambulatory Visit (INDEPENDENT_AMBULATORY_CARE_PROVIDER_SITE_OTHER): Payer: Medicaid Other | Admitting: Family Medicine

## 2020-10-30 ENCOUNTER — Other Ambulatory Visit: Payer: Self-pay

## 2020-10-30 ENCOUNTER — Encounter: Payer: Self-pay | Admitting: Family Medicine

## 2020-10-30 VITALS — BP 132/68 | HR 91 | Ht 69.0 in | Wt 378.0 lb

## 2020-10-30 DIAGNOSIS — D509 Iron deficiency anemia, unspecified: Secondary | ICD-10-CM | POA: Diagnosis not present

## 2020-10-30 DIAGNOSIS — N921 Excessive and frequent menstruation with irregular cycle: Secondary | ICD-10-CM

## 2020-10-30 DIAGNOSIS — D649 Anemia, unspecified: Secondary | ICD-10-CM

## 2020-10-30 LAB — POCT URINE PREGNANCY: Preg Test, Ur: NEGATIVE

## 2020-10-30 LAB — POCT HEMOGLOBIN: Hemoglobin: 6.5 g/dL — AB (ref 11–14.6)

## 2020-10-30 MED ORDER — NAPROXEN 500 MG PO TABS
550.0000 mg | ORAL_TABLET | Freq: Two times a day (BID) | ORAL | 1 refills | Status: DC | PRN
  Filled 2020-10-30 (×2): qty 60, 30d supply, fill #0

## 2020-10-30 MED ORDER — FERROUS SULFATE 325 (65 FE) MG PO TABS
325.0000 mg | ORAL_TABLET | Freq: Every day | ORAL | 1 refills | Status: DC
  Filled 2020-10-30: qty 30, 30d supply, fill #0

## 2020-10-30 MED ORDER — MEGESTROL ACETATE 40 MG PO TABS
40.0000 mg | ORAL_TABLET | Freq: Two times a day (BID) | ORAL | 0 refills | Status: AC
  Filled 2020-10-30: qty 28, 14d supply, fill #0

## 2020-10-30 NOTE — Patient Instructions (Signed)
It was wonderful to meet you today. Thank you for allowing me to be a part of your care. Below is a short summary of what we discussed at your visit today:  Heavy bleeding - We will do a fingerstick hemoglobin test to make sure your blood count is stable - Take Megace twice daily for 2 weeks to help with heavy bleeding - Take naproxen twice daily as needed for cramps - Take your iron supplementation daily - Follow-up in 2 weeks with [place], make an appointment at the front    Please bring all of your medications to every appointment!  If you have any questions or concerns, please do not hesitate to contact us via phone or MyChart message.   Fayette Pho, MD

## 2020-10-30 NOTE — Assessment & Plan Note (Signed)
Patient reports fatigue interfering with daily functioning.  Physical exam reassuring. POC hemoglobin 6.5, so venipuncture was obtained for CBC.  She reports not yet taking the p.o. iron supplementation. - Reordered ferrous sulfate 325 mg tablets - We will follow-up CBC hemoglobin

## 2020-10-30 NOTE — Assessment & Plan Note (Signed)
Patient presents with heavy menstrual bleeding x2 weeks and worsening cramps that limit her daily functioning.  Reports that these started immediately after her Nexplanon placement on 9/22 and have continued. - Megace 40 mg twice daily x2 weeks - Naproxen 500 mg twice daily as needed for cramps - Urine pregnancy test negative today - POC hemoglobin 6.5, so venipuncture was obtained for CBC - Reordered ferrous sulfate 325 mg tablets (she had not been taking these before)

## 2020-10-30 NOTE — Progress Notes (Deleted)
    SUBJECTIVE:   CHIEF COMPLAINT / HPI:   ***  PERTINENT  PMH / PSH: ***  OBJECTIVE:   BP 132/68   Pulse 91   Ht 5\' 9"  (1.753 m)   Wt (!) 378 lb (171.5 kg)   SpO2 100%   BMI 55.82 kg/m   ***  ASSESSMENT/PLAN:   No problem-specific Assessment & Plan notes found for this encounter.     , MD [facility]

## 2020-10-30 NOTE — Progress Notes (Signed)
    SUBJECTIVE:   CHIEF COMPLAINT / HPI:   Heavy bleeding - 10/10/20 nexplanon inserted to help with heavy bleeding - using 6 super size pads in a day - pretty heavy amount of blood - lots of clots - has bled over pads onto underwear - never had cramps this bad before - has never had heavy bleeding like this before - cramps getting worse every day - bad enough to get in the way of functioning - hard to drive or walk  Hx anemia - never before been told to take iron - last Hgb 09/24/20 was 8.2  - has fatigue, difficulty  completing tasks  PERTINENT  PMH / PSH: Migraine without aura, mild intermittent asthma, GERD, acne vulgaris, seasonal allergies, obesity Tomac ascitic anemia, menorrhagia, depressed mood  OBJECTIVE:   BP 132/68   Pulse 91   Ht 5\' 9"  (1.753 m)   Wt (!) 378 lb (171.5 kg)   SpO2 100%   BMI 55.82 kg/m    PHQ-9:  Depression screen [facility] 2/9 10/30/2020 10/10/2020 09/24/2020  Decreased Interest 0 0 0  Down, Depressed, Hopeless 0 0 1  PHQ - 2 Score 0 0 1  Altered sleeping 0 0 0  Tired, decreased energy 0 0 1  Change in appetite 0 0 0  Feeling bad or failure about yourself  0 0 0  Trouble concentrating 0 0 0  Moving slowly or fidgety/restless 0 0 0  Suicidal thoughts 0 0 0  PHQ-9 Score 0 0 2  Difficult doing work/chores Not difficult at all - -     GAD-7: No flowsheet data found.   Physical Exam General: Awake, alert, oriented HEENT: Gums and oral mucosa bright pink Cardiovascular: Regular rate and rhythm, S1 and S2 present, no murmurs auscultated, brisk cap refill Respiratory: Lung fields clear to auscultation bilaterally  ASSESSMENT/PLAN:   Menorrhagia Patient presents with heavy menstrual bleeding x2 weeks and worsening cramps that limit her daily functioning.  Reports that these started immediately after her Nexplanon placement on 9/22 and have continued. - Megace 40 mg twice daily x2 weeks - Naproxen 500 mg twice daily as needed for cramps - Urine  pregnancy test negative today - POC hemoglobin 6.5, so venipuncture was obtained for CBC - Reordered ferrous sulfate 325 mg tablets (she had not been taking these before)   Microcytic anemia Patient reports fatigue interfering with daily functioning.  Physical exam reassuring. POC hemoglobin 6.5, so venipuncture was obtained for CBC.  She reports not yet taking the p.o. iron supplementation. - Reordered ferrous sulfate 325 mg tablets - We will follow-up CBC hemoglobin     10/22, MD [facility] Medicine [facility]

## 2020-10-31 LAB — CBC
Hematocrit: 27 % — ABNORMAL LOW (ref 34.0–46.6)
Hemoglobin: 7.2 g/dL — ABNORMAL LOW (ref 11.1–15.9)
MCH: 17.1 pg — ABNORMAL LOW (ref 26.6–33.0)
MCHC: 26.7 g/dL — ABNORMAL LOW (ref 31.5–35.7)
MCV: 64 fL — ABNORMAL LOW (ref 79–97)
Platelets: 637 10*3/uL — ABNORMAL HIGH (ref 150–450)
RBC: 4.2 x10E6/uL (ref 3.77–5.28)
RDW: 17.2 % — ABNORMAL HIGH (ref 11.7–15.4)
WBC: 9.4 10*3/uL (ref 3.4–10.8)

## 2020-11-06 MED ORDER — ETONOGESTREL 68 MG ~~LOC~~ IMPL
68.0000 mg | DRUG_IMPLANT | Freq: Once | SUBCUTANEOUS | Status: AC
  Administered 2020-10-10: 68 mg via SUBCUTANEOUS

## 2020-11-06 NOTE — Addendum Note (Signed)
Addended by: Veronda Prude on: 11/06/2020 02:33 PM   Modules accepted: Orders

## 2020-11-11 ENCOUNTER — Other Ambulatory Visit (HOSPITAL_COMMUNITY): Payer: Self-pay

## 2020-11-18 ENCOUNTER — Telehealth: Payer: Self-pay | Admitting: Family Medicine

## 2020-11-18 ENCOUNTER — Encounter: Payer: Self-pay | Admitting: Family Medicine

## 2020-11-18 NOTE — Telephone Encounter (Signed)
HIPAA compliant callback message left.   Please remind her of her appointment for tomorrow when she returns the call. She need repeat hemoglobin and it is important she makes this appointment. Advise she arrives 5-10 minutes earlier. Thanks.

## 2020-11-19 ENCOUNTER — Ambulatory Visit (INDEPENDENT_AMBULATORY_CARE_PROVIDER_SITE_OTHER): Payer: Medicaid Other | Admitting: Family Medicine

## 2020-11-19 DIAGNOSIS — Z91199 Patient's noncompliance with other medical treatment and regimen due to unspecified reason: Secondary | ICD-10-CM

## 2020-11-19 NOTE — Progress Notes (Signed)
Hello Dr. Robyne Peers,  Your patient missed her appointment scheduled for 11/19/20. Please reach out to her soon regarding our no-show policy. Thanks.

## 2020-12-11 ENCOUNTER — Other Ambulatory Visit: Payer: Self-pay | Admitting: Family Medicine

## 2020-12-11 ENCOUNTER — Other Ambulatory Visit (HOSPITAL_COMMUNITY): Payer: Self-pay

## 2020-12-11 DIAGNOSIS — D649 Anemia, unspecified: Secondary | ICD-10-CM

## 2020-12-11 DIAGNOSIS — N921 Excessive and frequent menstruation with irregular cycle: Secondary | ICD-10-CM

## 2020-12-13 ENCOUNTER — Other Ambulatory Visit: Payer: Self-pay | Admitting: Family Medicine

## 2020-12-13 ENCOUNTER — Other Ambulatory Visit (HOSPITAL_COMMUNITY): Payer: Self-pay

## 2020-12-13 DIAGNOSIS — N921 Excessive and frequent menstruation with irregular cycle: Secondary | ICD-10-CM

## 2020-12-13 DIAGNOSIS — D649 Anemia, unspecified: Secondary | ICD-10-CM

## 2020-12-16 ENCOUNTER — Other Ambulatory Visit (HOSPITAL_COMMUNITY): Payer: Self-pay

## 2020-12-18 ENCOUNTER — Other Ambulatory Visit (HOSPITAL_COMMUNITY): Payer: Self-pay

## 2020-12-24 ENCOUNTER — Other Ambulatory Visit: Payer: Self-pay

## 2020-12-24 ENCOUNTER — Ambulatory Visit (INDEPENDENT_AMBULATORY_CARE_PROVIDER_SITE_OTHER): Payer: Medicaid Other | Admitting: Family Medicine

## 2020-12-24 ENCOUNTER — Other Ambulatory Visit (HOSPITAL_COMMUNITY): Payer: Self-pay

## 2020-12-24 VITALS — BP 130/70 | HR 84 | Ht 69.0 in | Wt 372.4 lb

## 2020-12-24 DIAGNOSIS — N921 Excessive and frequent menstruation with irregular cycle: Secondary | ICD-10-CM | POA: Diagnosis present

## 2020-12-24 MED ORDER — ESCITALOPRAM OXALATE 10 MG PO TABS
10.0000 mg | ORAL_TABLET | Freq: Every day | ORAL | 3 refills | Status: DC
Start: 2020-12-24 — End: 2022-03-26
  Filled 2020-12-24: qty 30, 30d supply, fill #0

## 2020-12-24 MED ORDER — MEGESTROL ACETATE 40 MG PO TABS
40.0000 mg | ORAL_TABLET | Freq: Every day | ORAL | 0 refills | Status: DC
  Filled 2020-12-24: qty 14, 14d supply, fill #0

## 2020-12-24 NOTE — Patient Instructions (Addendum)
It was great seeing you today!  You were seen for heavy bleeding and refill of Megace.  Since you are still bleeding heavily despite being off of the Megace for a couple of weeks, we are going to try another 2 weeks of Megace.  Hopefully this starts to regulate your cycle while your Nexplanon kicks in.  We expect that bleeding would stop following the Megace, and pick back up after, but your periods should lessen in severity.  Please return in 3 weeks to see if this round of Megace helped and if not to potentially pursue further work up.  Continue to take your oral iron.  I also refilled your Lexapro.  Feel free to call with any questions or concerns at any time, at [phone].   Take care,  Dr. Cora Collum [facility] Health [facility] Medicine Center

## 2020-12-24 NOTE — Progress Notes (Signed)
    SUBJECTIVE:   CHIEF COMPLAINT / HPI:   Debbie Smith is a 20 yo who presents to discuss irregular bleeding and refilling megace. She was seen on 10/12 for her heavy bleeding. She has Nexplanon in place since 9/22. Was started on oral iron and was prescribed Megace for 2 weeks. She has been off of the Megace for a couple of weeks and still endorses heavy periods that have not let up. She endorses using about 4 heavy pads a day and with clotting. Cramping occurs on and off, but when it is on its so intense and she cant do anything, difficult to breath or stand. Denies syncopal episodes but feels she has gotten close. Still taking oral iron. When on megace bleeding and cramping stopped. Did not need to use the Napxroxen prescribed while on Megace since she was asymptomatic. Has had to use it since being off of it. She states she has been dealing with heavy periods since 2019   OBJECTIVE:   BP 130/70   Pulse 84   Ht 5\' 9"  (1.753 m)   Wt (!) 372 lb 6.4 oz (168.9 kg)   SpO2 100%   BMI 54.99 kg/m    Physical exam  General: well appearing, NAD Cardiovascular: RRR, no murmurs Lungs: CTAB. Normal WOB Abdomen: soft, non-distended Skin: warm, dry. No edema   ASSESSMENT/PLAN:   No problem-specific Assessment & Plan notes found for this encounter.   Menorrhagia  Worsening bleeding and cramping since Nexplanon placement on 9/22. Has dealt with heavy bleeding since 2019. Was seen for this on 10/12 and was started on Megace 40 mg for 2 weeks.  Patient states that this stopped her bleeding and her cramps, but when she completed it, her periods continued and have not let up for the past couple of weeks.  We discussed how Megace is intended to work, that resuming her bleeding after stopping it is normal, and that ideally we should see her cycles regulate after the Megace and as her Nexplanon kicks in. Given we did not see this, we will trial another round of Megace 40mg  for 2 weeks. Advised to continue  oral iron.  Recommended follow-up with PCP in about 3-4 weeks. May want to consider further work up including pelvic ultrasound at follow up if no improvement   12/12, DO [facility] Health [facility] Medicine Center

## 2021-03-11 ENCOUNTER — Other Ambulatory Visit (HOSPITAL_COMMUNITY): Payer: Self-pay

## 2021-06-24 ENCOUNTER — Encounter: Payer: Self-pay | Admitting: *Deleted

## 2021-12-02 ENCOUNTER — Other Ambulatory Visit (HOSPITAL_COMMUNITY): Payer: Self-pay

## 2021-12-02 MED ORDER — IBUPROFEN 800 MG PO TABS
800.0000 mg | ORAL_TABLET | Freq: Four times a day (QID) | ORAL | 0 refills | Status: DC | PRN
  Filled 2021-12-02: qty 21, 6d supply, fill #0

## 2022-01-14 ENCOUNTER — Other Ambulatory Visit (HOSPITAL_COMMUNITY): Payer: Self-pay

## 2022-01-14 ENCOUNTER — Ambulatory Visit
Admission: EM | Admit: 2022-01-14 | Discharge: 2022-01-14 | Disposition: A | Discharge status: Home / Self Care | Payer: Medicaid Other | Attending: Urgent Care | Admitting: Urgent Care

## 2022-01-14 DIAGNOSIS — R102 Pelvic and perineal pain: Secondary | ICD-10-CM

## 2022-01-14 HISTORY — DX: Allergy status to unspecified drugs, medicaments and biological substances: Z88.9

## 2022-01-14 LAB — POCT URINE PREGNANCY: Preg Test, Ur: NEGATIVE

## 2022-01-14 LAB — POCT URINALYSIS DIP (MANUAL ENTRY)
Bilirubin, UA: NEGATIVE
Glucose, UA: NEGATIVE mg/dL
Ketones, POC UA: NEGATIVE mg/dL
Nitrite, UA: NEGATIVE
Protein Ur, POC: 30 mg/dL — AB
Spec Grav, UA: 1.025 (ref 1.010–1.025)
Urobilinogen, UA: 1 E.U./dL
pH, UA: 6 (ref 5.0–8.0)

## 2022-01-14 MED ORDER — IBUPROFEN 800 MG PO TABS
800.0000 mg | ORAL_TABLET | Freq: Three times a day (TID) | ORAL | 0 refills | Status: DC | PRN
  Filled 2022-01-14: qty 30, 10d supply, fill #0

## 2022-01-14 NOTE — ED Triage Notes (Addendum)
Pt c/o lower abd pain x 2 weeks-denies n/v/d, vaginal d/c, urinary sx-last BM today-NAD-steady gait-pt states she missed 2 days of work last week and needs a RTW note

## 2022-01-14 NOTE — ED Provider Notes (Signed)
[facility] - URGENT CARE CENTER  Note:  This document was prepared using Conservation officer, historic buildings and may include unintentional dictation errors.  MRN: [id number] DOB: 11-04-2000  Subjective:   Debbie Smith is a 21 y.o. female presenting for 2 week history of acute onset persistent left pelvic pain.  Denies fever, n/v, rashes, dysuria, urinary frequency, hematuria, vaginal discharge.  LMP was 12/11-15/2023, was regular. Has been told that she may have PCOS when she was having her implant for Nexplanon.   No current facility-administered medications for this encounter.  Current Outpatient Medications:    budesonide-formoterol (SYMBICORT) 80-4.5 MCG/ACT inhaler, Inhale 2 puffs into the lungs as needed (for shortness of breath or wheezing)., Disp: 10.2 g, Rfl: 3   cetirizine (ZYRTEC) 10 MG tablet, Take one tablet once daily as needed for allergies (Patient not taking: Reported on 12/24/2020), Disp: 30 tablet, Rfl: 11   escitalopram (LEXAPRO) 10 MG tablet, Take 1 tablet (10 mg total) by mouth daily., Disp: 30 tablet, Rfl: 3   ferrous sulfate (FERROUSUL) 325 (65 FE) MG tablet, Take 1 tablet (325 mg total) by mouth daily with breakfast., Disp: 30 tablet, Rfl: 1   ibuprofen (ADVIL) 800 MG tablet, Take 1 tablet (800 mg total) by mouth every 6 (six) hours as needed for pain., Disp: 21 tablet, Rfl: 0   megestrol (MEGACE) 40 MG tablet, Take 1 tablet (40 mg total) by mouth daily., Disp: 14 tablet, Rfl: 0   naproxen (NAPROSYN) 500 MG tablet, Take 1 tablet (500 mg total) by mouth 2 (two) times daily as needed for moderate pain., Disp: 60 tablet, Rfl: 1   Spacer/Aero-Holding Chambers (AEROCHAMBER MINI CHAMBER) DEVI, Use as directed, Disp: 1 each, Rfl: 0   No Known Allergies  Past Medical History:  Diagnosis Date   Acne    Allergy    Asthma    Depressed mood    GERD (gastroesophageal reflux disease)    H/O seasonal allergies    Headache    Moderate Persistent Asthma, poor  control 10/07/2012   Obesity      Past Surgical History:  Procedure Laterality Date   WISDOM TOOTH EXTRACTION      Family History  Problem Relation Age of Onset   Asthma Mother    Diabetes Mother    Hypertension Mother    Anemia Sister    Asthma Sister    Stroke Sister    Mental illness Sister    Learning disabilities Sister    Diabetes Maternal Aunt    Asthma Brother    Hypertension Brother    Mental illness Brother    Learning disabilities Brother    Diabetes Maternal Uncle    Cardiomyopathy Maternal Uncle    Sudden death Maternal Uncle    Kidney disease Maternal Uncle    Asthma Maternal Grandmother    Diabetes Maternal Grandmother    Cardiomyopathy Maternal Grandmother    Hypertension Maternal Grandmother    Kidney disease Maternal Grandmother    Heart attack Maternal Grandmother     Social History   Tobacco Use   Smoking status: Never    Passive exposure: Yes   Smokeless tobacco: Never  Vaping Use   Vaping Use: Never used  Substance Use Topics   Alcohol use: No   Drug use: No    ROS   Objective:   Vitals: BP (!) 159/80 (BP Location: Left Arm)   Pulse 96   Temp 99.2 F (37.3 C) (Oral)   Resp 20   SpO2  98%   Physical Exam Constitutional:      General: She is not in acute distress.    Appearance: Normal appearance. She is well-developed. She is not ill-appearing, toxic-appearing or diaphoretic.  HENT:     Head: Normocephalic and atraumatic.     Nose: Nose normal.     Mouth/Throat:     Mouth: Mucous membranes are moist.     Pharynx: Oropharynx is clear.  Eyes:     General: No scleral icterus.       Right eye: No discharge.        Left eye: No discharge.     Extraocular Movements: Extraocular movements intact.     Conjunctiva/sclera: Conjunctivae normal.  Cardiovascular:     Rate and Rhythm: Normal rate.  Pulmonary:     Effort: Pulmonary effort is normal.  Abdominal:     General: Bowel sounds are normal. There is no distension.      Palpations: Abdomen is soft. There is no mass.     Tenderness: There is abdominal tenderness (Left suprapubic) in the suprapubic area. There is no right CVA tenderness, left CVA tenderness, guarding or rebound.  Skin:    General: Skin is warm and dry.  Neurological:     General: No focal deficit present.     Mental Status: She is alert and oriented to person, place, and time.  Psychiatric:        Mood and Affect: Mood normal.        Behavior: Behavior normal.        Thought Content: Thought content normal.        Judgment: Judgment normal.     Results for orders placed or performed during the hospital encounter of 01/14/22 (from the past 24 hour(s))  POCT urinalysis dipstick     Status: Abnormal   Collection Time: 01/14/22  5:10 PM  Result Value Ref Range   Color, UA yellow yellow   Clarity, UA clear clear   Glucose, UA negative negative mg/dL   Bilirubin, UA negative negative   Ketones, POC UA negative negative mg/dL   Spec Grav, UA 1.025 1.010 - 1.025   Blood, UA moderate (A) negative   pH, UA 6.0 5.0 - 8.0   Protein Ur, POC =30 (A) negative mg/dL   Urobilinogen, UA 1.0 0.2 or 1.0 E.U./dL   Nitrite, UA Negative Negative   Leukocytes, UA Trace (A) Negative  POCT urine pregnancy     Status: None   Collection Time: 01/14/22  5:10 PM  Result Value Ref Range   Preg Test, Ur Negative Negative    Assessment and Plan :   PDMP not reviewed this encounter.  1. Pelvic pain     Patient declined STI testing, states that this is not a concern for her.  No sign of urinary tract infection on the urinalysis.  Will defer urine culture given lack of urinary symptoms.  Recommended that she pursue full workup for her PCOS and in the context of her pelvic pain, should be considered to get imaging with an ultrasound or CT scan as deemed appropriate by her PCP or a new gynecologist that she establishes care with.  Patient declined other types of NSAIDs including a Toradol injection in clinic  for her pain.  Will use ibuprofen for her pelvic pains. Counseled patient on potential for adverse effects with medications prescribed/recommended today, ER and return-to-clinic precautions discussed, patient verbalized understanding.    Jaynee Eagles, PA-C 01/14/22 1729

## 2022-01-15 ENCOUNTER — Other Ambulatory Visit (HOSPITAL_COMMUNITY): Payer: Self-pay

## 2022-01-28 ENCOUNTER — Other Ambulatory Visit (HOSPITAL_COMMUNITY): Payer: Self-pay

## 2022-02-05 ENCOUNTER — Other Ambulatory Visit (HOSPITAL_COMMUNITY): Payer: Self-pay

## 2022-03-26 ENCOUNTER — Other Ambulatory Visit: Payer: Self-pay | Admitting: Family Medicine

## 2022-03-26 ENCOUNTER — Other Ambulatory Visit (HOSPITAL_COMMUNITY): Payer: Self-pay

## 2022-03-26 MED ORDER — BUDESONIDE-FORMOTEROL FUMARATE 80-4.5 MCG/ACT IN AERO
2.0000 | INHALATION_SPRAY | RESPIRATORY_TRACT | 3 refills | Status: AC | PRN
  Filled 2022-03-26 – 2022-05-15 (×2): qty 10.2, 30d supply, fill #0
  Filled 2022-07-20 (×2): qty 10.2, 30d supply, fill #1

## 2022-03-26 MED ORDER — ESCITALOPRAM OXALATE 10 MG PO TABS
10.0000 mg | ORAL_TABLET | Freq: Every day | ORAL | 3 refills | Status: DC
  Filled 2022-03-26: qty 30, 30d supply, fill #0

## 2022-03-27 ENCOUNTER — Other Ambulatory Visit: Payer: Self-pay

## 2022-03-27 ENCOUNTER — Other Ambulatory Visit (HOSPITAL_COMMUNITY): Payer: Self-pay

## 2022-04-10 ENCOUNTER — Other Ambulatory Visit (HOSPITAL_COMMUNITY): Payer: Self-pay

## 2022-04-13 IMAGING — CR DG CHEST 2V
2 series · 2 of 2 positions shown · non-contrast
Comparison: No pertinent prior studies available for comparison.

CLINICAL DATA: Chest pain. Additional provided: Patient reports
left-sided chest pain onset today with shortness of breath, nausea
and diaphoresis.

EXAM:
CHEST - 2 VIEW

[chest pa]
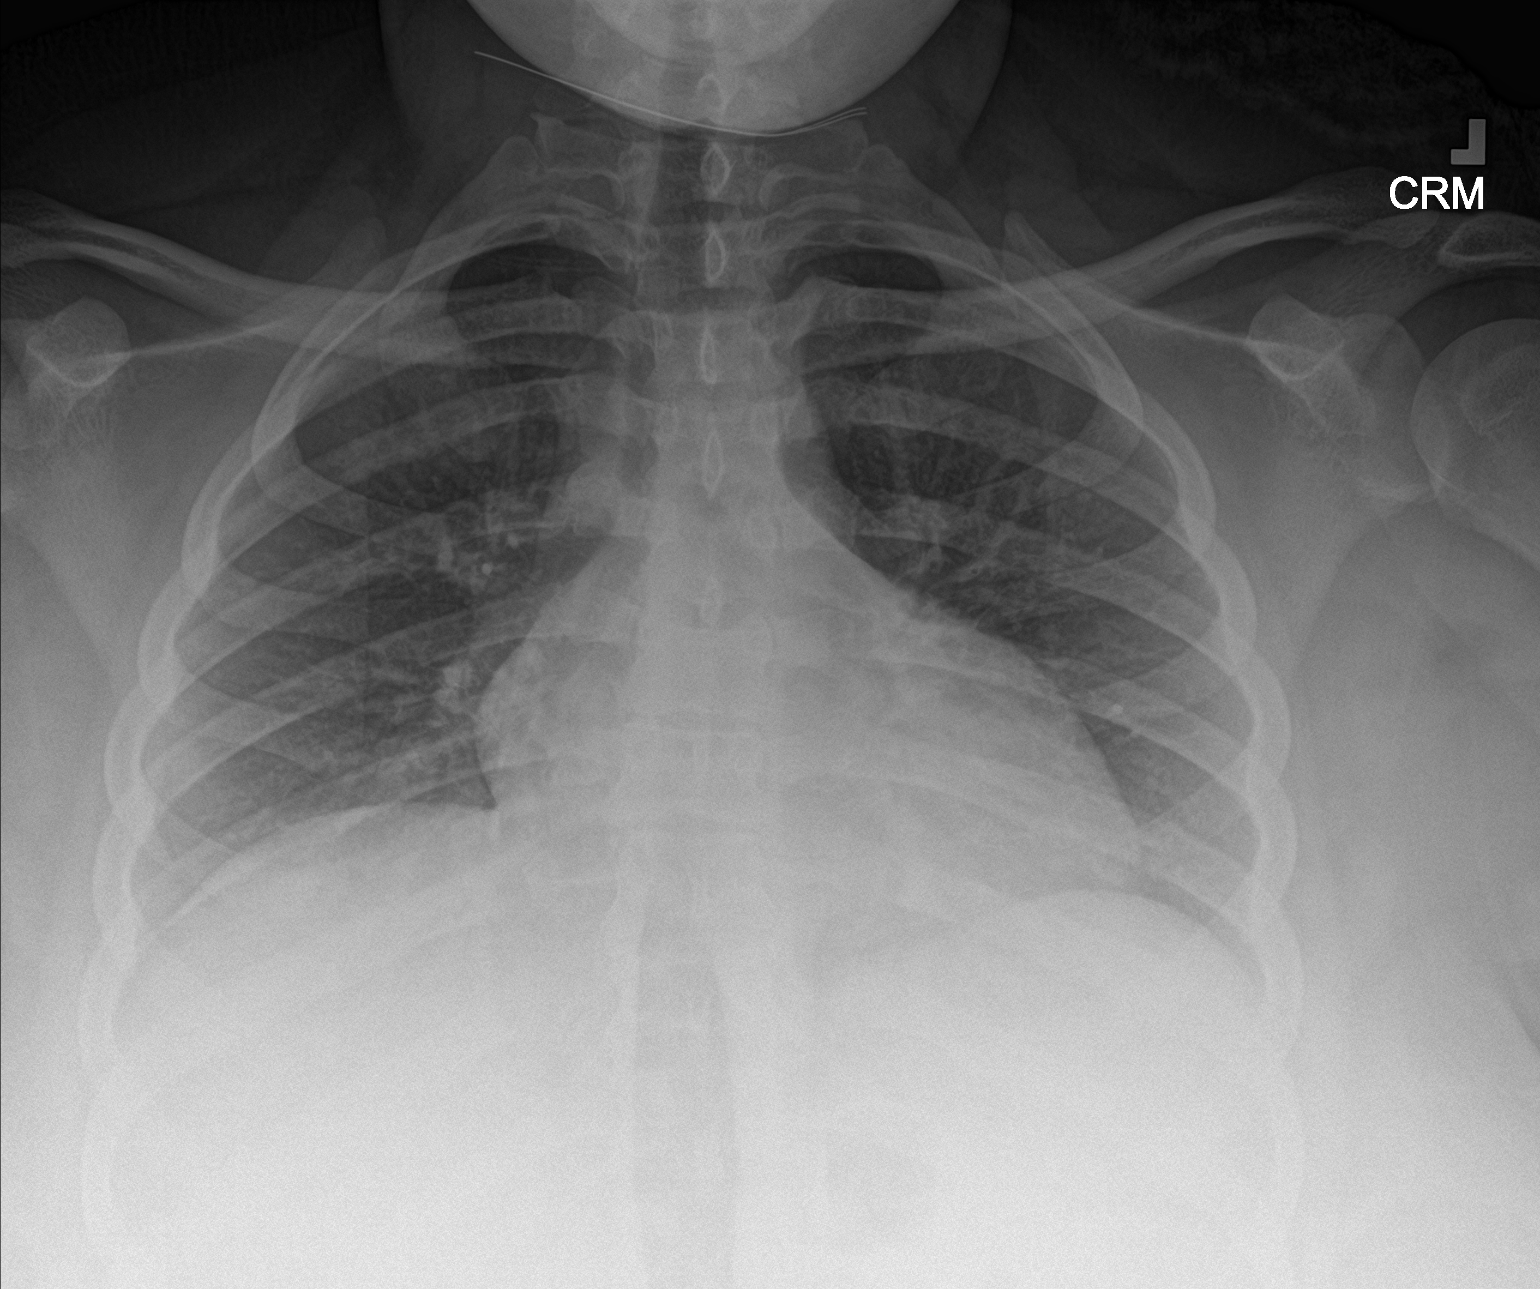

[chest lat]
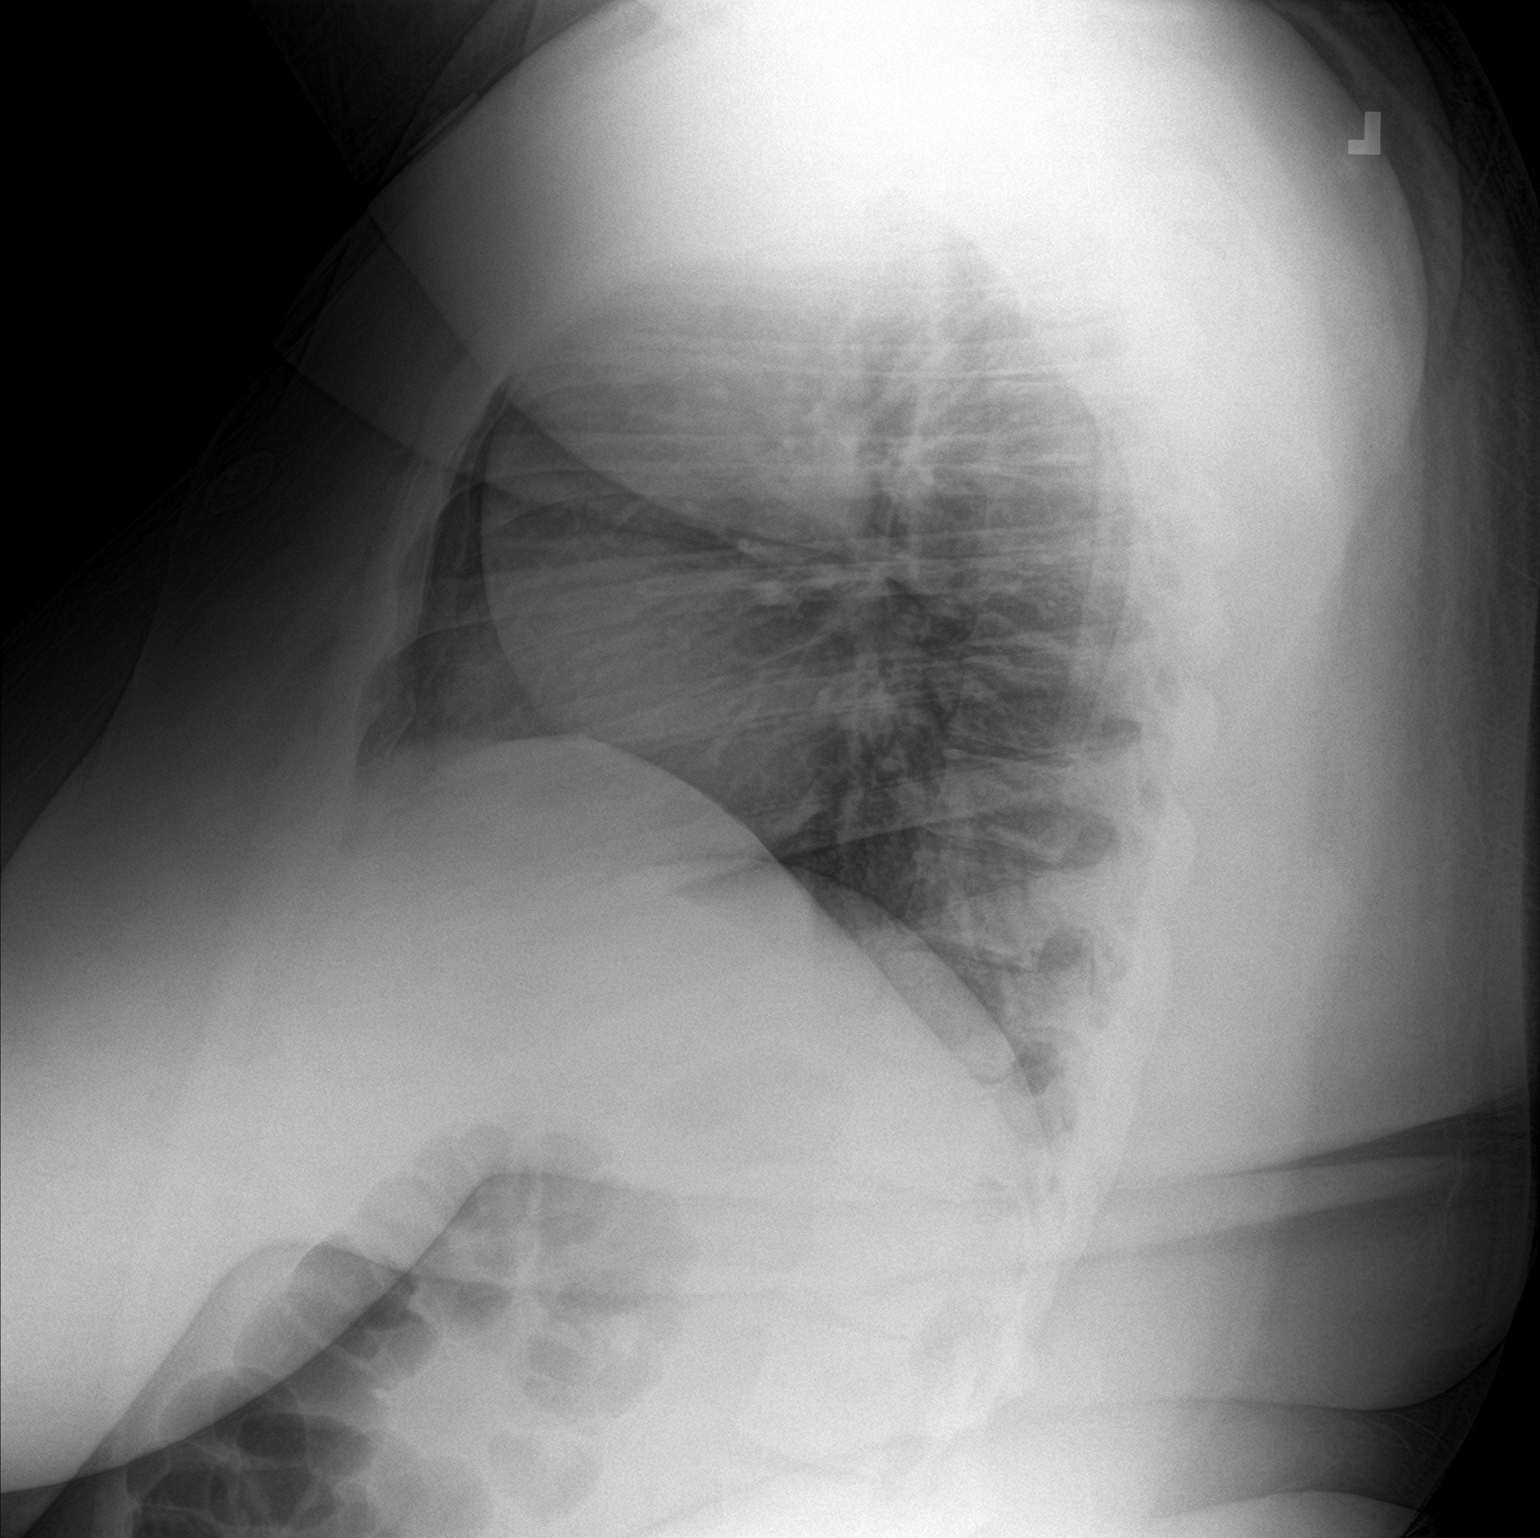

[2 of 2 positions shown; findings below may reference images not displayed]

FINDINGS: The cardiac silhouette is prominent in size, but is extension on
this shallow inspiration radiograph. No appreciable airspace
consolidation within the lungs. No evidence of pleural effusion or
pneumothorax. No acute bony abnormality identified.
IMPRESSION: The cardiac silhouette is prominent in size, although accentuated on
this shallow inspiration radiograph.

No evidence of airspace consolidation or pulmonary edema.

## 2022-05-04 ENCOUNTER — Encounter: Payer: Self-pay | Admitting: Family Medicine

## 2022-05-04 ENCOUNTER — Other Ambulatory Visit (HOSPITAL_COMMUNITY): Payer: Self-pay

## 2022-05-04 ENCOUNTER — Ambulatory Visit (INDEPENDENT_AMBULATORY_CARE_PROVIDER_SITE_OTHER): Payer: Medicaid Other | Admitting: Family Medicine

## 2022-05-04 VITALS — BP 136/81 | HR 78 | Ht 69.0 in | Wt 368.1 lb

## 2022-05-04 DIAGNOSIS — Z3009 Encounter for other general counseling and advice on contraception: Secondary | ICD-10-CM | POA: Diagnosis not present

## 2022-05-04 DIAGNOSIS — R7303 Prediabetes: Secondary | ICD-10-CM | POA: Diagnosis present

## 2022-05-04 DIAGNOSIS — L83 Acanthosis nigricans: Secondary | ICD-10-CM | POA: Diagnosis not present

## 2022-05-04 DIAGNOSIS — L219 Seborrheic dermatitis, unspecified: Secondary | ICD-10-CM | POA: Insufficient documentation

## 2022-05-04 DIAGNOSIS — Z113 Encounter for screening for infections with a predominantly sexual mode of transmission: Secondary | ICD-10-CM | POA: Insufficient documentation

## 2022-05-04 LAB — POCT GLYCOSYLATED HEMOGLOBIN (HGB A1C): Hemoglobin A1C: 5.9 % — AB (ref 4.0–5.6)

## 2022-05-04 MED ORDER — KETOCONAZOLE 2 % EX CREA
1.0000 | TOPICAL_CREAM | Freq: Two times a day (BID) | CUTANEOUS | 0 refills | Status: DC
  Filled 2022-05-04 – 2022-05-15 (×2): qty 60, 30d supply, fill #0

## 2022-05-04 NOTE — Patient Instructions (Addendum)
It was great seeing you today!  Today we discussed changing your birth control, we will remove the implant and start you on the pills at the next visit.   I believe the dryness on your face is due to seborrhoic dermatitis. I have prescribed ketoconazole cream, please apply this twice daily for 4 weeks.   The darkened areas on your face can sometimes be indicative of diabetes, we will also check an A1c as well.   Please follow up at your next scheduled appointment on 5/3 at 9:50 am, if anything arises between now and then, please don't hesitate to contact our office.   Thank you for allowing [place] to be a part of your medical care!  Thank you, Dr. Robyne Peers  Also a reminder of our clinic's no-show policy. Please make sure to arrive at least 15 minutes prior to your scheduled appointment time. Please try to cancel before 24 hours if you are not able to make it. If you no-show for 2 appointments then you will be receiving a warning letter. If you no-show after 3 visits, then you may be at risk of being dismissed from our clinic. This is to ensure that everyone is able to be seen in a timely manner. Thank you, we appreciate your assistance with this!

## 2022-05-04 NOTE — Addendum Note (Signed)
Addended by: Reece Leader on: 05/04/2022 10:38 AM   Modules accepted: Level of Service

## 2022-05-04 NOTE — Assessment & Plan Note (Signed)
-  prescribed ketaconazole, patient agreeable to hold off on dermatology referral at this time -follow up in 4 weeks if no improvement

## 2022-05-04 NOTE — Assessment & Plan Note (Signed)
-  will plan to remove nexplanon and restart OCPs -appointment scheduled on 5/3 for implant removal

## 2022-05-04 NOTE — Assessment & Plan Note (Signed)
-  seems most consistent with this, concerning for DM diagnosis, last A1c 6 within prediabetes range  -pending A1c -diet and exercise counseling

## 2022-05-04 NOTE — Progress Notes (Addendum)
    SUBJECTIVE:   CHIEF COMPLAINT / HPI:   Patient presents wanting to discuss changing her contraception. Currently has nexplanon placed in October 2022, was using OCPs in the past and wants to switch back. Since having the implant, she started to have abdominal cramping. Has flow has been normal with some irregularity in the frequency of her cycle. Denies tobacco use. History of migraines that have improved, gets them far less frequently and at the most twice a month.   Reports facial dryness, sometimes has flare-ups of acne but the dryness seems to stay. Every morning she wakes up with patches of dryness. Using moisturizers aveeno face wash and cedefil. Has been ongoing since she was 22 years old and seems to be getting worse. She would like a dermatology referral.   OBJECTIVE:   BP 136/81   Pulse 78   Ht 5\' 9"  (1.753 m)   Wt (!) 368 lb 2 oz (167 kg)   LMP 04/06/2022   SpO2 100%   BMI 54.36 kg/m   General: Patient well-appearing, in no acute distress. HEENT: thickened, hyperpigmented area along nasal ridge, posterior neck and axillary region, dry patches noted along nasolabial folds, eyebrows and cheeks with minimal along hair line CV: RRR, no murmurs or gallops auscultated Resp: CTAB, no wheezing, rales or rhonchi noted  MSK: nexplanon in place in right UE Psych: mood appropriate, pleasant   ASSESSMENT/PLAN:   Encounter for counseling regarding contraception -will plan to remove nexplanon and restart OCPs -appointment scheduled on 5/3 for implant removal  Seborrheic dermatitis -prescribed ketaconazole, patient agreeable to hold off on dermatology referral at this time -follow up in 4 weeks if no improvement   Acanthosis nigricans -seems most consistent with this, concerning for DM diagnosis, last A1c 6 within prediabetes range  -pending A1c -diet and exercise counseling      Reece Leader, DO [facility] Health [facility] Medicine Center

## 2022-05-14 ENCOUNTER — Other Ambulatory Visit (HOSPITAL_COMMUNITY): Payer: Self-pay

## 2022-05-15 ENCOUNTER — Other Ambulatory Visit (HOSPITAL_COMMUNITY): Payer: Self-pay

## 2022-05-21 ENCOUNTER — Other Ambulatory Visit (HOSPITAL_COMMUNITY): Payer: Self-pay

## 2022-05-22 ENCOUNTER — Other Ambulatory Visit: Payer: Self-pay

## 2022-05-22 ENCOUNTER — Ambulatory Visit (INDEPENDENT_AMBULATORY_CARE_PROVIDER_SITE_OTHER): Payer: Medicaid Other | Admitting: Family Medicine

## 2022-05-22 ENCOUNTER — Encounter: Payer: Self-pay | Admitting: Family Medicine

## 2022-05-22 ENCOUNTER — Other Ambulatory Visit (HOSPITAL_COMMUNITY): Payer: Self-pay

## 2022-05-22 VITALS — BP 128/82 | HR 74 | Ht 69.0 in | Wt 367.1 lb

## 2022-05-22 DIAGNOSIS — L219 Seborrheic dermatitis, unspecified: Secondary | ICD-10-CM

## 2022-05-22 DIAGNOSIS — R7303 Prediabetes: Secondary | ICD-10-CM

## 2022-05-22 DIAGNOSIS — N921 Excessive and frequent menstruation with irregular cycle: Secondary | ICD-10-CM

## 2022-05-22 LAB — POCT URINE PREGNANCY: Preg Test, Ur: NEGATIVE

## 2022-05-22 MED ORDER — NORGESTIMATE-ETH ESTRADIOL 0.25-35 MG-MCG PO TABS
1.0000 | ORAL_TABLET | Freq: Every day | ORAL | 11 refills | Status: AC
  Filled 2022-05-22 – 2022-07-20 (×3): qty 28, 28d supply, fill #0

## 2022-05-22 NOTE — Assessment & Plan Note (Addendum)
-  discussed A1c 5.9 result from recent visit and how this is related to her findings of prior acanthosis nigricans.  -diet and exercise counseling provided, discussed importance of lifestyle modifications to prevent further progression to DM diagnosis and to improve overall health. Handout provided. -patient agreeable to healthy weight and wellness, referral placed -follow up in 3 months to monitor progression and recheck A1c

## 2022-05-22 NOTE — Progress Notes (Signed)
    SUBJECTIVE:   CHIEF COMPLAINT / HPI:   Patient presents for follow up. Still having irregular menstrual cycles that last anywhere from a week to a few weeks. LMP 04/06/2022. Heavier periods occasionally with intermittent mild abdominal cramping. Notes some irregularity in cycle as well, as stated above. Says that her rash has improved with the ketoconazole since the last visit. She tells me that she has a strong family history of diabetes, we discussed her recent A1c and my concern for her health as I do not want her to develop diabetes. She agreed and knows that she also wants to improve on this as well, she is open to working with healthy weight and wellness.   OBJECTIVE:   BP 128/82   Pulse 74   Ht 5\' 9"  (1.753 m)   Wt (!) 367 lb 2 oz (166.5 kg)   LMP 04/06/2022   SpO2 100%   BMI 54.21 kg/m   General: Patient well-appearing, in no acute distress. CV: RRR, no murmurs or gallops auscultated Resp: CTAB, no wheezing, rales or rhonchi noted Psych: mood appropriate, very pleasant   ASSESSMENT/PLAN:   Menorrhagia -will trial OCPs, patient agreeable to not having nexplanon removed today in case she decides to continue with nexplanon if not satisfied with OCP trial. Appropriate as nexplanon was placed in 2022 so still effective and may help with her menorrhagia.  -upreg negative  -med rec reviewed and updated appropriately   Seborrheic dermatitis -feels that ketoconazole is working well and reports it has improved her rash, will continue this  Prediabetes -discussed A1c 5.9 result from recent visit and how this is related to her findings of prior acanthosis nigricans.  -diet and exercise counseling provided, discussed importance of lifestyle modifications to prevent further progression to DM diagnosis and to improve overall health. Handout provided. -patient agreeable to healthy weight and wellness, referral placed -follow up in 3 months to monitor progression and recheck A1c      -PHQ-9 score of 0 reviewed.   Reece Leader, DO [facility] Medicine Center

## 2022-05-22 NOTE — Patient Instructions (Signed)
It was great seeing you today!  Today we discussed many things, I have prescribed oral contraceptive pills. Please make sure to take 1 tablet daily, this should help regular your cycles better. If this seems to work well then we can schedule a visit in a few months to remove the nexplanon. We will also do a pregnancy test.   Your A1c was within the prediabetes range, I do not want you to develop diabetes. As we discussed, please incorporate vegetables into your diet. Try to eat lean protein such as baked or grilled chicken, fish or [place]. I have also placed a referral to healthy weight and wellness, you should hear from them within 2-3 weeks to schedule your first appointment.   Please follow up at your next scheduled appointment in 3 months, if anything arises between now and then, please don't hesitate to contact our office.   Thank you for allowing [place] to be a part of your medical care!  Thank you, Dr. Robyne Peers

## 2022-05-22 NOTE — Assessment & Plan Note (Signed)
-  feels that ketoconazole is working well and reports it has improved her rash, will continue this

## 2022-05-22 NOTE — Assessment & Plan Note (Addendum)
-  will trial OCPs, patient agreeable to not having nexplanon removed today in case she decides to continue with nexplanon if not satisfied with OCP trial. Appropriate as nexplanon was placed in 2022 so still effective and may help with her menorrhagia.  -upreg negative  -med rec reviewed and updated appropriately

## 2022-06-04 ENCOUNTER — Other Ambulatory Visit (HOSPITAL_COMMUNITY): Payer: Self-pay

## 2022-06-28 ENCOUNTER — Other Ambulatory Visit: Payer: Self-pay | Admitting: Family Medicine

## 2022-06-28 DIAGNOSIS — D649 Anemia, unspecified: Secondary | ICD-10-CM

## 2022-06-28 DIAGNOSIS — N921 Excessive and frequent menstruation with irregular cycle: Secondary | ICD-10-CM

## 2022-06-28 DIAGNOSIS — L219 Seborrheic dermatitis, unspecified: Secondary | ICD-10-CM

## 2022-06-29 ENCOUNTER — Other Ambulatory Visit (HOSPITAL_COMMUNITY): Payer: Self-pay

## 2022-06-29 ENCOUNTER — Other Ambulatory Visit: Payer: Self-pay

## 2022-06-29 MED ORDER — KETOCONAZOLE 2 % EX CREA
1.0000 | TOPICAL_CREAM | Freq: Two times a day (BID) | CUTANEOUS | 0 refills | Status: AC
Start: 2022-06-29 — End: ?
  Filled 2022-06-29 – 2023-04-27 (×4): qty 60, 30d supply, fill #0

## 2022-06-29 MED ORDER — NAPROXEN 500 MG PO TABS
500.0000 mg | ORAL_TABLET | Freq: Two times a day (BID) | ORAL | 1 refills | Status: AC | PRN
Start: 2022-06-29 — End: ?
  Filled 2022-06-29 – 2023-04-01 (×2): qty 60, 30d supply, fill #0

## 2022-07-13 ENCOUNTER — Other Ambulatory Visit (HOSPITAL_COMMUNITY): Payer: Self-pay

## 2022-07-14 ENCOUNTER — Encounter: Payer: Self-pay | Admitting: Family Medicine

## 2022-07-14 ENCOUNTER — Ambulatory Visit (INDEPENDENT_AMBULATORY_CARE_PROVIDER_SITE_OTHER): Payer: Medicaid Other | Admitting: Family Medicine

## 2022-07-14 VITALS — BP 139/89 | HR 80 | Ht 69.0 in | Wt 364.0 lb

## 2022-07-14 DIAGNOSIS — L83 Acanthosis nigricans: Secondary | ICD-10-CM

## 2022-07-14 NOTE — Progress Notes (Signed)
    SUBJECTIVE:   CHIEF COMPLAINT / HPI:   Patient presents for nexplanon removal. She has had a history of irregular menstrual cycles, was previously only on nexplanon for contraception which was placed in 2022. She then started on OCPs to help with her irregular cycles, this seemed to only very slightly improve her symptoms. She is requesting to have the nexplanon removed. She is also wanting a referral to gynecology for a PAP smear, I explained to her that we can do this in our office here and she is agreeable to this.   She is also wanting a dermatology referral for the rash area on her nose. This has been present for years and she has has not noticed much improvement in the area. It is not painful but she would like to have it resolved. Thus she is requesting to be evaluated by a dermatologist.   OBJECTIVE:   BP 139/89   Pulse 80   Ht 5\' 9"  (1.753 m)   Wt (!) 364 lb (165.1 kg)   LMP 06/28/2022   SpO2 100%   BMI 53.75 kg/m   General: Patient well-appearing, in no acute distress. Resp: normal work of breathing noted MSK: nexplanon implant faintly palpated within the right upper extremity  Derm: thickened, hyperpigmented area along nasal ridge and posterior neck bilaterally Psych: mood appropriate, very pleasant   ASSESSMENT/PLAN:   Menorrhagia -some improvement noted, continue OCPs per patient preference -ultrasound performed with attending, Dr. Jennette Kettle. Nexplanon visible, scheduled in colposcopy clinic on 6/27 for removal. -will monitor for changes after nexplanon removal   Acanthosis nigricans -discussed that rash is due to acanthosis nigricans  -extensively discussed diet and exercise counseling -dermatology referral placed   Health maintenance -PAP scheduled on 7/15, patient agrees to not needing gynecology referral at this time   Reece Leader, DO [facility] Health [facility] Medicine Center

## 2022-07-14 NOTE — Assessment & Plan Note (Signed)
-  discussed that rash is due to acanthosis nigricans  -extensively discussed diet and exercise counseling -dermatology referral placed

## 2022-07-14 NOTE — Patient Instructions (Addendum)
It was great seeing you today!  Today we discussed your nexplanon, this is very deep in the skin as we saw on ultrasound. We will schedule you in clinic on Thursday to have this removed at 11:30 am.   I have placed a referral to the dermatologist for the rash. This occurs when skin becomes thickened and changes color which can often be from weight changes. Please make sure to eat a balanced diet and try to stay active to help this as well as your overall health.  You are scheduled with Dr. Jena Gauss on 7/15 at 2:30 pm for your PAP smear.   Please follow up at your next scheduled appointment on 6/27 at 11:30 am, if anything arises between now and then, please don't hesitate to contact our office.   Thank you for allowing [place] to be a part of your medical care!  Thank you, Dr. Robyne Peers

## 2022-07-14 NOTE — Assessment & Plan Note (Signed)
-  some improvement noted, continue OCPs per patient preference -ultrasound performed with attending, Dr. Jennette Kettle. Nexplanon visible, scheduled in colposcopy clinic on 6/27 for removal. -will monitor for changes after nexplanon removal

## 2022-07-16 ENCOUNTER — Ambulatory Visit (INDEPENDENT_AMBULATORY_CARE_PROVIDER_SITE_OTHER): Payer: Medicaid Other | Admitting: Family Medicine

## 2022-07-16 ENCOUNTER — Ambulatory Visit: Payer: Medicaid Other

## 2022-07-16 VITALS — BP 120/84 | HR 83 | Ht 69.0 in | Wt 359.8 lb

## 2022-07-16 DIAGNOSIS — Z3009 Encounter for other general counseling and advice on contraception: Secondary | ICD-10-CM | POA: Diagnosis present

## 2022-07-16 NOTE — Patient Instructions (Signed)
It was great to see you! Thank you for allowing me to participate in your care!  We removed your nexplanon today. You can take the bandage off in 30 minutes or later if preferred. For pain, take ibuprofen 400 to 600 mg every 6 hours as needed. Return if you notice any sign of infection including fever, erythema, or drainage.    Take care and seek immediate care sooner if you develop any concerns.   Dr. Erick Alley, DO [facility]

## 2022-07-16 NOTE — Progress Notes (Signed)
Family [facility] Center Attending Note: I have seen and examined this patient. I have discussed this patient with the resident and reviewed the assessment and plan as documented above. I agree with the resident's findings and plan.  I was present and assisted with the entire procedure.  It was a difficult removal as the Nexplanon had become quite encapsulated and muscle and adipose tissue and had to be teased out.  Patient tolerated the procedure extremely well.  Less than 5 cc estimated blood loss.

## 2022-07-16 NOTE — Assessment & Plan Note (Signed)
Nexplanon removed today with no complications. Pt plans to continue sprintec.

## 2022-07-16 NOTE — Progress Notes (Signed)
    SUBJECTIVE:   CHIEF COMPLAINT / HPI:   Nexplanon removal Pt requests removal of Nexplanon which was placed 10/10/2020. She is currently taking Sprintec and would like to try only taking the OCP for cycle control and birthcontrol.     OBJECTIVE:   BP 120/84   Pulse 83   Ht 5\' 9"  (1.753 m)   Wt (!) 359 lb 12.8 oz (163.2 kg)   LMP 06/28/2022   SpO2 98%   BMI 53.13 kg/m    General: NAD, pleasant, able to participate in exam Cardiac: Well perfused  Respiratory: normal effort Extremities: nexplanon palpable in RUE  Nexplanon Removal:  Patient given informed consent for removal of her Implanon, time out was performed.  Signed copy in the chart.  Appropriate time out taken. Implanon site identified.  Area prepped in usual  fashion with alcohol and betadine. One cc of 1% lidocaine was used to anesthetize the area at the mid portion of the implant. A small stab incision was made above the implant on the midportion.  The implanon rod was grasped using hemostats and removed without difficulty.  There was less than 3 cc blood loss. There were no complications.  A small amount of antibiotic ointment and steri-strips were applied over the small incision.  A pressure bandage was applied to reduce any bruising.  The patient tolerated the procedure well and was given post procedure instructions.   ASSESSMENT/PLAN:   Encounter for counseling regarding contraception Nexplanon removed today with no complications. Pt plans to continue sprintec.      Dr. Erick Alley, DO [facility]

## 2022-07-20 ENCOUNTER — Other Ambulatory Visit (HOSPITAL_COMMUNITY): Payer: Self-pay

## 2022-08-03 ENCOUNTER — Other Ambulatory Visit (HOSPITAL_COMMUNITY)
Admission: RE | Admit: 2022-08-03 | Discharge: 2022-08-03 | Disposition: A | Discharge status: Home / Self Care | Payer: Medicaid Other | Source: Ambulatory Visit | Attending: Family Medicine | Admitting: Family Medicine

## 2022-08-03 ENCOUNTER — Ambulatory Visit (INDEPENDENT_AMBULATORY_CARE_PROVIDER_SITE_OTHER): Payer: Medicaid Other | Admitting: Student

## 2022-08-03 ENCOUNTER — Other Ambulatory Visit (HOSPITAL_COMMUNITY): Payer: Self-pay

## 2022-08-03 ENCOUNTER — Encounter: Payer: Self-pay | Admitting: Student

## 2022-08-03 VITALS — BP 131/70 | HR 79 | Ht 69.0 in | Wt 358.4 lb

## 2022-08-03 DIAGNOSIS — Z113 Encounter for screening for infections with a predominantly sexual mode of transmission: Secondary | ICD-10-CM | POA: Diagnosis present

## 2022-08-03 DIAGNOSIS — Z124 Encounter for screening for malignant neoplasm of cervix: Secondary | ICD-10-CM

## 2022-08-03 NOTE — Progress Notes (Signed)
  SUBJECTIVE:   CHIEF COMPLAINT / HPI:   Cervical Cancer Screening  - last PAP: N/A - Sexually active: Yes - Contraception: Nexplanon was not a good fit for her, currently using nothing as OCPs were not helpful for her  - Denies abnormal vaginal bleeding or discharge. - Wants STI testing as well   PERTINENT  PMH / PSH:   Acne  Migraine without aura  Asthma  GERD Depression history Prediabetes   Patient Care Team: Alfredo Martinez, MD as PCP - General (Family Medicine) Keturah Shavers, MD as Consulting Physician (Neurology) OBJECTIVE:  BP 131/70   Pulse 79   Ht 5\' 9"  (1.753 m)   Wt (!) 358 lb 6 oz (162.6 kg)   LMP 06/28/2022   SpO2 100%   BMI 52.92 kg/m  Physical Exam  General: NAD, pleasant, able to participate in exam Respiratory: No respiratory distress Skin: warm and dry, no rashes noted Psych: Normal affect and mood GU: white discharge, normal amount in vaginal vault, visualized cervix-no abnormalities, supervised by CMA   ASSESSMENT/PLAN:  Screen for sexually transmitted diseases Assessment & Plan: Reviewed labs and allergies, will check GC/Chlamydia, Trichomonas, RPR, and HIV and will call patient with results. Patient's questions answered to their satisfaction.   Orders: -     HIV Antibody (routine testing w rflx) -     RPR -     Cytology - PAP  Screening for cervical cancer Assessment & Plan: Pap performed. Will update with results, tolerated well.   Orders: -     Cytology - PAP   Return if symptoms worsen or fail to improve. Alfredo Martinez, MD 08/03/2022, 2:46 PM PGY-3, [facility]

## 2022-08-03 NOTE — Assessment & Plan Note (Signed)
Pap performed. Will update with results, tolerated well.

## 2022-08-03 NOTE — Assessment & Plan Note (Signed)
Reviewed labs and allergies, will check GC/Chlamydia, Trichomonas, RPR, and HIV and will call patient with results. Patient's questions answered to their satisfaction.

## 2022-08-03 NOTE — Patient Instructions (Addendum)
It was great to see you today! Thank you for choosing [facility] for your primary care. Debbie Smith was seen for pap smear.  Today we addressed: We will update you with pap smear results  I will update you with the testing results  Call if you have a concern   If you haven't already, sign up for My Chart to have easy access to your labs results, and communication with your primary care physician.  I recommend that you always bring your medications to each appointment as this makes it easy to ensure you are on the correct medications and helps [place] not miss refills when you need them. Call the clinic at [phone] if your symptoms worsen or you have any concerns.  You should return to our clinic Return if symptoms worsen or fail to improve. Please arrive 15 minutes before your appointment to ensure smooth check in process.  We appreciate your efforts in making this happen.  Thank you for allowing me to participate in your care, Alfredo Martinez, MD 08/03/2022, 2:31 PM PGY-3, [facility]

## 2022-08-04 LAB — RPR: RPR Ser Ql: NONREACTIVE

## 2022-08-04 LAB — HIV ANTIBODY (ROUTINE TESTING W REFLEX): HIV Screen 4th Generation wRfx: NONREACTIVE

## 2022-08-05 ENCOUNTER — Other Ambulatory Visit (HOSPITAL_COMMUNITY): Payer: Self-pay

## 2022-08-05 ENCOUNTER — Other Ambulatory Visit: Payer: Self-pay | Admitting: Student

## 2022-08-05 LAB — CYTOLOGY - PAP
Adequacy: ABSENT
Chlamydia: NEGATIVE
Comment: NEGATIVE
Comment: NEGATIVE
Comment: NORMAL
Diagnosis: NEGATIVE
Neisseria Gonorrhea: NEGATIVE
Trichomonas: POSITIVE — AB

## 2022-08-05 MED ORDER — METRONIDAZOLE 500 MG PO TABS
500.0000 mg | ORAL_TABLET | Freq: Two times a day (BID) | ORAL | 0 refills | Status: AC
  Filled 2022-08-05: qty 14, 7d supply, fill #0

## 2023-04-01 ENCOUNTER — Other Ambulatory Visit: Payer: Self-pay

## 2023-04-02 ENCOUNTER — Other Ambulatory Visit (HOSPITAL_COMMUNITY): Payer: Self-pay

## 2023-04-02 ENCOUNTER — Other Ambulatory Visit: Payer: Self-pay

## 2023-04-07 ENCOUNTER — Other Ambulatory Visit (HOSPITAL_COMMUNITY): Payer: Self-pay

## 2023-04-07 ENCOUNTER — Other Ambulatory Visit: Payer: Self-pay

## 2023-04-27 ENCOUNTER — Other Ambulatory Visit: Payer: Self-pay

## 2023-04-27 ENCOUNTER — Other Ambulatory Visit (HOSPITAL_COMMUNITY): Payer: Self-pay

## 2023-05-13 ENCOUNTER — Other Ambulatory Visit (HOSPITAL_COMMUNITY): Payer: Self-pay

## 2023-06-29 ENCOUNTER — Encounter: Payer: Self-pay | Admitting: *Deleted

## 2023-07-05 ENCOUNTER — Ambulatory Visit (INDEPENDENT_AMBULATORY_CARE_PROVIDER_SITE_OTHER): Admitting: Physician Assistant

## 2023-07-05 DIAGNOSIS — Z91199 Patient's noncompliance with other medical treatment and regimen due to unspecified reason: Secondary | ICD-10-CM

## 2023-07-05 NOTE — Progress Notes (Signed)
 Patient no-showed today's appointment.

## 2024-01-18 ENCOUNTER — Ambulatory Visit: Admitting: Family Medicine

## 2024-01-20 ENCOUNTER — Other Ambulatory Visit: Payer: Self-pay

## 2024-02-25 ENCOUNTER — Ambulatory Visit
Admission: EM | Admit: 2024-02-25 | Discharge: 2024-02-25 | Disposition: A | Discharge status: Home / Self Care | Source: Home / Self Care | Attending: Family Medicine | Admitting: Family Medicine

## 2024-02-25 ENCOUNTER — Encounter: Payer: Self-pay | Admitting: Emergency Medicine

## 2024-02-25 DIAGNOSIS — J069 Acute upper respiratory infection, unspecified: Secondary | ICD-10-CM

## 2024-02-25 DIAGNOSIS — R051 Acute cough: Secondary | ICD-10-CM

## 2024-02-25 LAB — POC COVID19/FLU A&B COMBO
Covid Antigen, POC: NEGATIVE
Influenza A Antigen, POC: NEGATIVE
Influenza B Antigen, POC: NEGATIVE

## 2024-02-25 NOTE — ED Provider Notes (Signed)
 " [facility]    CSN: [id number] Arrival date & time: 02/25/24  1345      History   Chief Complaint Chief Complaint  Patient presents with   Cough   Sore Throat   Nasal Congestion    HPI Debbie Smith is a 24 y.o. female.    Cough Associated symptoms: rhinorrhea and sore throat   Sore Throat  Patient is here for URI symptoms x 5 days.  Having cough,congestion, sore throat.  Chest tightness, no wheezing.  She does have h/o asthma, has an inhaler at home, but has not needed it.  No fevers.  No otc medications used.        Past Medical History:  Diagnosis Date   Acne    Allergy    Asthma    Depressed mood    GERD (gastroesophageal reflux disease)    H/O seasonal allergies    Headache    Moderate Persistent Asthma, poor control 10/07/2012   Obesity     Patient Active Problem List   Diagnosis Date Noted   Screening for cervical cancer 08/03/2022   Prediabetes 05/22/2022   Screen for sexually transmitted diseases 05/04/2022   Seborrheic dermatitis 05/04/2022   Menorrhagia 09/24/2020   No-show for appointment 07/08/2020   Depression, recurrent 03/29/2020   Mild intermittent asthma, uncomplicated 06/10/2016   Microcytic anemia 03/06/2016   Migraine without aura and without status migrainosus, not intractable 09/24/2015   Tension headache 09/24/2015   Acanthosis nigricans 02/22/2015   Vitamin D  deficiency 10/15/2014   Gastroesophageal reflux disease without esophagitis 09/13/2014   Depressed mood 09/13/2014   Elevated BP 09/13/2014   Acne vulgaris 09/13/2014   Obesity, unspecified 10/07/2012   Seasonal allergies 10/07/2012    Past Surgical History:  Procedure Laterality Date   WISDOM TOOTH EXTRACTION      OB History   No obstetric history on file.      Home Medications    Prior to Admission medications  Medication Sig Start Date End Date Taking? Authorizing Provider  budesonide -formoterol  (SYMBICORT ) 80-4.5 MCG/ACT  inhaler Inhale 2 puffs into the lungs as needed (for shortness of breath or wheezing). 03/26/22   Ganta, Anupa, DO  ketoconazole  (NIZORAL ) 2 % cream Apply 1 Application topically 2 (two) times daily. 06/29/22   Espinoza, Alejandra, DO  naproxen  (NAPROSYN ) 500 MG tablet Take 1 tablet (500 mg total) by mouth 2 (two) times daily as needed for moderate pain. 06/29/22   Espinoza, Alejandra, DO  norgestimate -ethinyl estradiol  (SPRINTEC  28) 0.25-35 MG-MCG tablet Take 1 tablet by mouth daily. 05/22/22   Ganta, Anupa, DO  Spacer/Aero-Holding Chambers (AEROCHAMBER MINI CHAMBER) DEVI Use as directed 07/01/20   Luke Vernell BRAVO, MD    Family History Family History  Problem Relation Age of Onset   Asthma Mother    Diabetes Mother    Hypertension Mother    Anemia Sister    Asthma Sister    Stroke Sister    Mental illness Sister    Learning disabilities Sister    Diabetes Maternal Aunt    Asthma Brother    Hypertension Brother    Mental illness Brother    Learning disabilities Brother    Diabetes Maternal Uncle    Cardiomyopathy Maternal Uncle    Sudden death Maternal Uncle    Kidney disease Maternal Uncle    Asthma Maternal Grandmother    Diabetes Maternal Grandmother    Cardiomyopathy Maternal Grandmother    Hypertension Maternal Grandmother    Kidney disease Maternal  Grandmother    Heart attack Maternal Grandmother     Social History Social History[1]   Allergies   Patient has no known allergies.   Review of Systems Review of Systems  Constitutional:  Positive for fatigue.  HENT:  Positive for congestion, rhinorrhea and sore throat.   Respiratory:  Positive for cough.   Cardiovascular: Negative.   Gastrointestinal: Negative.   Genitourinary: Negative.   Musculoskeletal: Negative.   Psychiatric/Behavioral: Negative.       Physical Exam Triage Vital Signs ED Triage Vitals  Encounter Vitals Group     BP 02/25/24 1405 129/83     Girls Systolic BP Percentile --      Girls Diastolic  BP Percentile --      Boys Systolic BP Percentile --      Boys Diastolic BP Percentile --      Pulse Rate 02/25/24 1405 97     Resp 02/25/24 1405 18     Temp 02/25/24 1405 98.2 F (36.8 C)     Temp Source 02/25/24 1405 Oral     SpO2 02/25/24 1405 98 %     Weight --      Height --      Head Circumference --      Peak Flow --      Pain Score 02/25/24 1404 0     Pain Loc --      Pain Education --      Exclude from Growth Chart --    No data found.  Updated Vital Signs BP 129/83 (BP Location: Left Arm)   Pulse 97   Temp 98.2 F (36.8 C) (Oral)   Resp 18   LMP 02/16/2024   SpO2 98%   Visual Acuity Right Eye Distance:   Left Eye Distance:   Bilateral Distance:    Right Eye Near:   Left Eye Near:    Bilateral Near:     Physical Exam Constitutional:      General: She is not in acute distress.    Appearance: She is well-developed and normal weight. She is not ill-appearing or toxic-appearing.  HENT:     Nose: Congestion and rhinorrhea present.     Mouth/Throat:     Mouth: Mucous membranes are moist.  Cardiovascular:     Rate and Rhythm: Normal rate and regular rhythm.     Heart sounds: Normal heart sounds.  Pulmonary:     Effort: Pulmonary effort is normal.     Breath sounds: Normal breath sounds. No wheezing or rhonchi.  Musculoskeletal:     Cervical back: Normal range of motion and neck supple.  Lymphadenopathy:     Cervical: No cervical adenopathy.  Neurological:     Mental Status: She is alert.      UC Treatments / Results  Labs (all labs ordered are listed, but only abnormal results are displayed) Labs Reviewed  POC COVID19/FLU A&B COMBO    EKG   Radiology No results found.  Procedures Procedures (including critical care time)  Medications Ordered in UC Medications - No data to display  Initial Impression / Assessment and Plan / UC Course  I have reviewed the triage vital signs and the nursing notes.  Pertinent labs & imaging results  that were available during my care of the patient were reviewed by me and considered in my medical decision making (see chart for details).    Final Clinical Impressions(s) / UC Diagnoses   Final diagnoses:  Acute cough  Viral upper respiratory infection  Discharge Instructions      You were seen today for upper respiratory symptoms.  Your flu/covid swab was negative.  This is likely due to another virus.  Please get rest and fluids.  You may take over the counter mucinex, dayqul or nyquil for your symptoms.  Use your inhaler if you feel chest tightness or wheezing.  Follow up if not improving or worsening.     ED Prescriptions   None    PDMP not reviewed this encounter.     [1]  Social History Tobacco Use   Smoking status: Never    Passive exposure: Yes   Smokeless tobacco: Never  Vaping Use   Vaping status: Never Used  Substance Use Topics   Alcohol use: No   Drug use: No     Darral Longs, MD 02/25/24 1433  "

## 2024-02-25 NOTE — Discharge Instructions (Signed)
 You were seen today for upper respiratory symptoms.  Your flu/covid swab was negative.  This is likely due to another virus.  Please get rest and fluids.  You may take over the counter mucinex, dayqul or nyquil for your symptoms.  Use your inhaler if you feel chest tightness or wheezing.  Follow up if not improving or worsening.

## 2024-02-25 NOTE — ED Triage Notes (Signed)
 Pt c/o productive cough, chest tightness, nasal congestion and sore throat x 5 days. Pt has not taken any medication for her symptoms.
